# Patient Record
Sex: Male | Born: 1965 | Race: White | Hispanic: No | Marital: Single | Smoking: Current every day smoker
Health system: Southern US, Community
[De-identification: ages and names within clinical notes are randomized; demographics above are authoritative.]

## PROBLEM LIST (undated history)

## (undated) DIAGNOSIS — J189 Pneumonia, unspecified organism: Secondary | ICD-10-CM

## (undated) DIAGNOSIS — I214 Non-ST elevation (NSTEMI) myocardial infarction: Secondary | ICD-10-CM

## (undated) DIAGNOSIS — F17200 Nicotine dependence, unspecified, uncomplicated: Secondary | ICD-10-CM

## (undated) DIAGNOSIS — F419 Anxiety disorder, unspecified: Secondary | ICD-10-CM

## (undated) DIAGNOSIS — M542 Cervicalgia: Secondary | ICD-10-CM

## (undated) DIAGNOSIS — W3400XA Accidental discharge from unspecified firearms or gun, initial encounter: Secondary | ICD-10-CM

## (undated) DIAGNOSIS — M25519 Pain in unspecified shoulder: Secondary | ICD-10-CM

## (undated) DIAGNOSIS — S14105A Unspecified injury at C5 level of cervical spinal cord, initial encounter: Secondary | ICD-10-CM

## (undated) DIAGNOSIS — G8929 Other chronic pain: Secondary | ICD-10-CM

## (undated) DIAGNOSIS — G8250 Quadriplegia, unspecified: Secondary | ICD-10-CM

## (undated) HISTORY — PX: PEG TUBE PLACEMENT: SUR1034

---

## 1998-11-25 ENCOUNTER — Encounter (HOSPITAL_COMMUNITY): Admission: RE | Admit: 1998-11-25 | Discharge: 1999-02-23 | Payer: Self-pay | Admitting: Plastic Surgery

## 2000-02-25 ENCOUNTER — Encounter: Admission: RE | Admit: 2000-02-25 | Discharge: 2000-02-25 | Payer: Self-pay | Admitting: Family Medicine

## 2000-02-25 ENCOUNTER — Encounter: Payer: Self-pay | Admitting: Family Medicine

## 2001-04-19 ENCOUNTER — Encounter: Admission: RE | Admit: 2001-04-19 | Discharge: 2001-07-09 | Payer: Self-pay | Admitting: Anesthesiology

## 2001-05-16 ENCOUNTER — Ambulatory Visit (HOSPITAL_BASED_OUTPATIENT_CLINIC_OR_DEPARTMENT_OTHER): Admission: RE | Admit: 2001-05-16 | Discharge: 2001-05-16 | Payer: Self-pay | Admitting: Oral Surgery

## 2004-02-26 ENCOUNTER — Ambulatory Visit (HOSPITAL_COMMUNITY): Admission: RE | Admit: 2004-02-26 | Discharge: 2004-02-26 | Payer: Self-pay | Admitting: Oral Surgery

## 2004-04-11 ENCOUNTER — Encounter
Admission: RE | Admit: 2004-04-11 | Discharge: 2004-07-10 | Payer: Self-pay | Admitting: Physical Medicine and Rehabilitation

## 2004-09-04 ENCOUNTER — Encounter: Admission: RE | Admit: 2004-09-04 | Discharge: 2004-09-04 | Payer: Self-pay

## 2011-08-31 ENCOUNTER — Encounter (HOSPITAL_BASED_OUTPATIENT_CLINIC_OR_DEPARTMENT_OTHER): Payer: Medicare Other | Attending: Internal Medicine

## 2011-08-31 DIAGNOSIS — L8990 Pressure ulcer of unspecified site, unspecified stage: Secondary | ICD-10-CM | POA: Insufficient documentation

## 2011-08-31 DIAGNOSIS — F17200 Nicotine dependence, unspecified, uncomplicated: Secondary | ICD-10-CM | POA: Insufficient documentation

## 2011-08-31 DIAGNOSIS — L89509 Pressure ulcer of unspecified ankle, unspecified stage: Secondary | ICD-10-CM | POA: Insufficient documentation

## 2011-08-31 DIAGNOSIS — IMO0002 Reserved for concepts with insufficient information to code with codable children: Secondary | ICD-10-CM | POA: Insufficient documentation

## 2011-08-31 DIAGNOSIS — G8250 Quadriplegia, unspecified: Secondary | ICD-10-CM | POA: Insufficient documentation
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
## 2011-09-29 ENCOUNTER — Encounter (HOSPITAL_BASED_OUTPATIENT_CLINIC_OR_DEPARTMENT_OTHER): Payer: Medicare Other

## 2011-10-05 ENCOUNTER — Encounter (HOSPITAL_BASED_OUTPATIENT_CLINIC_OR_DEPARTMENT_OTHER): Payer: Medicare Other

## 2014-10-08 ENCOUNTER — Encounter (HOSPITAL_COMMUNITY): Payer: Self-pay | Admitting: *Deleted

## 2014-10-08 ENCOUNTER — Inpatient Hospital Stay (HOSPITAL_COMMUNITY): Payer: Medicare Other

## 2014-10-08 ENCOUNTER — Inpatient Hospital Stay (HOSPITAL_COMMUNITY)
Admission: EM | Admit: 2014-10-08 | Discharge: 2014-10-24 | DRG: 004 | Disposition: A | Discharge status: Other Acute Inpatient Hospital | Payer: Medicare Other | Attending: Internal Medicine | Admitting: Internal Medicine

## 2014-10-08 ENCOUNTER — Emergency Department (HOSPITAL_COMMUNITY): Payer: Medicare Other

## 2014-10-08 DIAGNOSIS — R0602 Shortness of breath: Secondary | ICD-10-CM | POA: Diagnosis present

## 2014-10-08 DIAGNOSIS — J9620 Acute and chronic respiratory failure, unspecified whether with hypoxia or hypercapnia: Secondary | ICD-10-CM

## 2014-10-08 DIAGNOSIS — F17200 Nicotine dependence, unspecified, uncomplicated: Secondary | ICD-10-CM | POA: Diagnosis present

## 2014-10-08 DIAGNOSIS — J9622 Acute and chronic respiratory failure with hypercapnia: Secondary | ICD-10-CM | POA: Diagnosis present

## 2014-10-08 DIAGNOSIS — W3400XS Accidental discharge from unspecified firearms or gun, sequela: Secondary | ICD-10-CM | POA: Diagnosis not present

## 2014-10-08 DIAGNOSIS — G8929 Other chronic pain: Secondary | ICD-10-CM | POA: Diagnosis present

## 2014-10-08 DIAGNOSIS — M542 Cervicalgia: Secondary | ICD-10-CM | POA: Diagnosis present

## 2014-10-08 DIAGNOSIS — J9690 Respiratory failure, unspecified, unspecified whether with hypoxia or hypercapnia: Secondary | ICD-10-CM

## 2014-10-08 DIAGNOSIS — Z4659 Encounter for fitting and adjustment of other gastrointestinal appliance and device: Secondary | ICD-10-CM

## 2014-10-08 DIAGNOSIS — G8250 Quadriplegia, unspecified: Secondary | ICD-10-CM | POA: Diagnosis present

## 2014-10-08 DIAGNOSIS — Y95 Nosocomial condition: Secondary | ICD-10-CM | POA: Diagnosis not present

## 2014-10-08 DIAGNOSIS — J9621 Acute and chronic respiratory failure with hypoxia: Secondary | ICD-10-CM | POA: Diagnosis present

## 2014-10-08 DIAGNOSIS — I10 Essential (primary) hypertension: Secondary | ICD-10-CM | POA: Diagnosis not present

## 2014-10-08 DIAGNOSIS — J9601 Acute respiratory failure with hypoxia: Secondary | ICD-10-CM | POA: Diagnosis present

## 2014-10-08 DIAGNOSIS — G8254 Quadriplegia, C5-C7 incomplete: Secondary | ICD-10-CM | POA: Diagnosis present

## 2014-10-08 DIAGNOSIS — Z0189 Encounter for other specified special examinations: Secondary | ICD-10-CM

## 2014-10-08 DIAGNOSIS — J449 Chronic obstructive pulmonary disease, unspecified: Secondary | ICD-10-CM | POA: Diagnosis present

## 2014-10-08 DIAGNOSIS — A419 Sepsis, unspecified organism: Principal | ICD-10-CM | POA: Diagnosis present

## 2014-10-08 DIAGNOSIS — E876 Hypokalemia: Secondary | ICD-10-CM | POA: Diagnosis present

## 2014-10-08 DIAGNOSIS — J189 Pneumonia, unspecified organism: Secondary | ICD-10-CM | POA: Diagnosis present

## 2014-10-08 DIAGNOSIS — L89152 Pressure ulcer of sacral region, stage 2: Secondary | ICD-10-CM | POA: Diagnosis not present

## 2014-10-08 DIAGNOSIS — S14155S Other incomplete lesion at C5 level of cervical spinal cord, sequela: Secondary | ICD-10-CM | POA: Diagnosis not present

## 2014-10-08 DIAGNOSIS — E8729 Other acidosis: Secondary | ICD-10-CM | POA: Insufficient documentation

## 2014-10-08 DIAGNOSIS — G904 Autonomic dysreflexia: Secondary | ICD-10-CM | POA: Diagnosis present

## 2014-10-08 DIAGNOSIS — Z9289 Personal history of other medical treatment: Secondary | ICD-10-CM

## 2014-10-08 DIAGNOSIS — F419 Anxiety disorder, unspecified: Secondary | ICD-10-CM | POA: Diagnosis present

## 2014-10-08 DIAGNOSIS — E870 Hyperosmolality and hypernatremia: Secondary | ICD-10-CM | POA: Diagnosis not present

## 2014-10-08 DIAGNOSIS — F17210 Nicotine dependence, cigarettes, uncomplicated: Secondary | ICD-10-CM | POA: Diagnosis present

## 2014-10-08 DIAGNOSIS — R339 Retention of urine, unspecified: Secondary | ICD-10-CM | POA: Diagnosis not present

## 2014-10-08 DIAGNOSIS — Z681 Body mass index (BMI) 19 or less, adult: Secondary | ICD-10-CM

## 2014-10-08 DIAGNOSIS — R739 Hyperglycemia, unspecified: Secondary | ICD-10-CM | POA: Diagnosis present

## 2014-10-08 DIAGNOSIS — R197 Diarrhea, unspecified: Secondary | ICD-10-CM | POA: Diagnosis not present

## 2014-10-08 DIAGNOSIS — E875 Hyperkalemia: Secondary | ICD-10-CM | POA: Diagnosis not present

## 2014-10-08 DIAGNOSIS — E46 Unspecified protein-calorie malnutrition: Secondary | ICD-10-CM | POA: Diagnosis present

## 2014-10-08 DIAGNOSIS — Z930 Tracheostomy status: Secondary | ICD-10-CM

## 2014-10-08 DIAGNOSIS — Z978 Presence of other specified devices: Secondary | ICD-10-CM

## 2014-10-08 DIAGNOSIS — G9341 Metabolic encephalopathy: Secondary | ICD-10-CM | POA: Diagnosis present

## 2014-10-08 DIAGNOSIS — E43 Unspecified severe protein-calorie malnutrition: Secondary | ICD-10-CM | POA: Diagnosis present

## 2014-10-08 DIAGNOSIS — Z8701 Personal history of pneumonia (recurrent): Secondary | ICD-10-CM | POA: Diagnosis not present

## 2014-10-08 DIAGNOSIS — Z7189 Other specified counseling: Secondary | ICD-10-CM

## 2014-10-08 DIAGNOSIS — J9600 Acute respiratory failure, unspecified whether with hypoxia or hypercapnia: Secondary | ICD-10-CM | POA: Insufficient documentation

## 2014-10-08 DIAGNOSIS — E872 Acidosis: Secondary | ICD-10-CM | POA: Diagnosis present

## 2014-10-08 DIAGNOSIS — R609 Edema, unspecified: Secondary | ICD-10-CM | POA: Diagnosis not present

## 2014-10-08 DIAGNOSIS — I248 Other forms of acute ischemic heart disease: Secondary | ICD-10-CM | POA: Diagnosis present

## 2014-10-08 HISTORY — DX: Unspecified injury at C5 level of cervical spinal cord, initial encounter: S14.105A

## 2014-10-08 HISTORY — DX: Anxiety disorder, unspecified: F41.9

## 2014-10-08 HISTORY — DX: Pain in unspecified shoulder: M25.519

## 2014-10-08 HISTORY — DX: Other chronic pain: G89.29

## 2014-10-08 HISTORY — DX: Nicotine dependence, unspecified, uncomplicated: F17.200

## 2014-10-08 HISTORY — DX: Cervicalgia: M54.2

## 2014-10-08 HISTORY — DX: Quadriplegia, unspecified: G82.50

## 2014-10-08 LAB — COMPREHENSIVE METABOLIC PANEL
ALT: 10 U/L (ref 0–53)
ANION GAP: 13 (ref 5–15)
AST: 14 U/L (ref 0–37)
Albumin: 3.6 g/dL (ref 3.5–5.2)
Alkaline Phosphatase: 116 U/L (ref 39–117)
BILIRUBIN TOTAL: 0.5 mg/dL (ref 0.3–1.2)
BUN: 8 mg/dL (ref 6–23)
CHLORIDE: 88 meq/L — AB (ref 96–112)
CO2: 38 meq/L — AB (ref 19–32)
Calcium: 9.7 mg/dL (ref 8.4–10.5)
Creatinine, Ser: 0.3 mg/dL — ABNORMAL LOW (ref 0.50–1.35)
GFR calc Af Amer: >90 mL/min (ref >90)
GLUCOSE: 136 mg/dL — AB (ref 70–99)
Potassium: 3.3 mEq/L — ABNORMAL LOW (ref 3.7–5.3)
Sodium: 139 mEq/L (ref 137–147)
Total Protein: 7.2 g/dL (ref 6.0–8.3)

## 2014-10-08 LAB — URINALYSIS, ROUTINE W REFLEX MICROSCOPIC
Bilirubin Urine: NEGATIVE
Glucose, UA: NEGATIVE mg/dL
Hgb urine dipstick: NEGATIVE
KETONES UR: 15 mg/dL — AB
LEUKOCYTES UA: NEGATIVE
NITRITE: NEGATIVE
PROTEIN: NEGATIVE mg/dL
Specific Gravity, Urine: 1.013 (ref 1.005–1.030)
Urobilinogen, UA: 1 mg/dL (ref 0.0–1.0)
pH: 6 (ref 5.0–8.0)

## 2014-10-08 LAB — CBC WITH DIFFERENTIAL/PLATELET
Basophils Absolute: 0 10*3/uL (ref 0.0–0.1)
Basophils Relative: 0 % (ref 0–1)
Eosinophils Absolute: 0 10*3/uL (ref 0.0–0.7)
Eosinophils Relative: 0 % (ref 0–5)
HCT: 51.3 % (ref 39.0–52.0)
HEMOGLOBIN: 16.9 g/dL (ref 13.0–17.0)
LYMPHS PCT: 5 % — AB (ref 12–46)
Lymphs Abs: 0.7 10*3/uL (ref 0.7–4.0)
MCH: 32.3 pg (ref 26.0–34.0)
MCHC: 32.9 g/dL (ref 30.0–36.0)
MCV: 97.9 fL (ref 78.0–100.0)
MONO ABS: 0.9 10*3/uL (ref 0.1–1.0)
MONOS PCT: 6 % (ref 3–12)
NEUTROS ABS: 13.5 10*3/uL — AB (ref 1.7–7.7)
Neutrophils Relative %: 89 % — ABNORMAL HIGH (ref 43–77)
Platelets: 193 10*3/uL (ref 150–400)
RBC: 5.24 MIL/uL (ref 4.22–5.81)
RDW: 12.9 % (ref 11.5–15.5)
WBC: 15.1 10*3/uL — AB (ref 4.0–10.5)

## 2014-10-08 LAB — BLOOD GAS, ARTERIAL
Acid-Base Excess: 12.4 mmol/L — ABNORMAL HIGH (ref 0.0–2.0)
Bicarbonate: 43.9 mEq/L — ABNORMAL HIGH (ref 20.0–24.0)
DELIVERY SYSTEMS: POSITIVE
DRAWN BY: 308601
Drawn by: 308601
EXPIRATORY PAP: 5
FIO2: 1 %
FIO2: 1 %
Inspiratory PAP: 15
Mode: POSITIVE
O2 SAT: 98.1 %
O2 Saturation: 94.7 %
PATIENT TEMPERATURE: 98.6
PCO2 ART: 90.1 mmHg — AB (ref 35.0–45.0)
PH ART: 7.308 — AB (ref 7.350–7.450)
Patient temperature: 98.6
TCO2: 38.3 mmol/L (ref 0–100)
pH, Arterial: 7.226 — ABNORMAL LOW (ref 7.350–7.450)
pO2, Arterial: 75.3 mmHg — ABNORMAL LOW (ref 80.0–100.0)
pO2, Arterial: 131 mmHg — ABNORMAL HIGH (ref 80.0–100.0)

## 2014-10-08 LAB — I-STAT TROPONIN, ED: Troponin i, poc: 0.12 ng/mL (ref 0.00–0.08)

## 2014-10-08 LAB — I-STAT CG4 LACTIC ACID, ED: LACTIC ACID, VENOUS: 0.98 mmol/L (ref 0.5–2.2)

## 2014-10-08 MED ORDER — DEXTROSE 5 % IV SOLN
500.0000 mg | Freq: Once | INTRAVENOUS | Status: AC
  Administered 2014-10-08: 500 mg via INTRAVENOUS
  Filled 2014-10-08: qty 500

## 2014-10-08 MED ORDER — IPRATROPIUM BROMIDE 0.02 % IN SOLN
0.5000 mg | Freq: Once | RESPIRATORY_TRACT | Status: AC
  Administered 2014-10-08: 0.5 mg via RESPIRATORY_TRACT
  Filled 2014-10-08: qty 2.5

## 2014-10-08 MED ORDER — ALBUTEROL SULFATE (2.5 MG/3ML) 0.083% IN NEBU
5.0000 mg | INHALATION_SOLUTION | Freq: Once | RESPIRATORY_TRACT | Status: AC
  Administered 2014-10-08: 5 mg via RESPIRATORY_TRACT
  Filled 2014-10-08: qty 6

## 2014-10-08 MED ORDER — SODIUM CHLORIDE 0.9 % IV SOLN
250.0000 mL | INTRAVENOUS | Status: DC | PRN
  Administered 2014-10-11: 250 mL via INTRAVENOUS

## 2014-10-08 MED ORDER — POTASSIUM CHLORIDE 10 MEQ/100ML IV SOLN
10.0000 meq | INTRAVENOUS | Status: DC
  Administered 2014-10-09 (×3): 10 meq via INTRAVENOUS
  Filled 2014-10-08 (×4): qty 100

## 2014-10-08 MED ORDER — FENTANYL CITRATE 0.05 MG/ML IJ SOLN
50.0000 ug | Freq: Once | INTRAMUSCULAR | Status: AC
  Administered 2014-10-08: 50 ug via INTRAVENOUS

## 2014-10-08 MED ORDER — DEXTROSE 5 % IV SOLN
1.0000 g | Freq: Once | INTRAVENOUS | Status: AC
  Administered 2014-10-08: 1 g via INTRAVENOUS
  Filled 2014-10-08: qty 10

## 2014-10-08 MED ORDER — PROPOFOL 10 MG/ML IV EMUL
0.0000 ug/kg/min | INTRAVENOUS | Status: DC
  Administered 2014-10-08: 5 ug/kg/min via INTRAVENOUS
  Administered 2014-10-09 (×3): 35 ug/kg/min via INTRAVENOUS
  Administered 2014-10-10: 30 ug/kg/min via INTRAVENOUS
  Administered 2014-10-10: 50 ug/kg/min via INTRAVENOUS
  Administered 2014-10-10: 30 ug/kg/min via INTRAVENOUS
  Administered 2014-10-11 – 2014-10-12 (×5): 50 ug/kg/min via INTRAVENOUS
  Administered 2014-10-12: 60 ug/kg/min via INTRAVENOUS
  Administered 2014-10-12: 25 ug/kg/min via INTRAVENOUS
  Administered 2014-10-13: 50 ug/kg/min via INTRAVENOUS
  Administered 2014-10-13: 49.632 ug/kg/min via INTRAVENOUS
  Administered 2014-10-13: 45 ug/kg/min via INTRAVENOUS
  Administered 2014-10-14: 25 ug/kg/min via INTRAVENOUS
  Administered 2014-10-14: 35 ug/kg/min via INTRAVENOUS
  Administered 2014-10-14: 40 ug/kg/min via INTRAVENOUS
  Administered 2014-10-15 (×3): 35 ug/kg/min via INTRAVENOUS
  Administered 2014-10-16: 34.926 ug/kg/min via INTRAVENOUS
  Administered 2014-10-16: 35 ug/kg/min via INTRAVENOUS
  Filled 2014-10-08 (×27): qty 100

## 2014-10-08 MED ORDER — ROCURONIUM BROMIDE 50 MG/5ML IV SOLN
INTRAVENOUS | Status: AC
  Filled 2014-10-08: qty 2

## 2014-10-08 MED ORDER — HEPARIN SODIUM (PORCINE) 5000 UNIT/ML IJ SOLN
5000.0000 [IU] | Freq: Three times a day (TID) | INTRAMUSCULAR | Status: DC
  Administered 2014-10-09 – 2014-10-24 (×44): 5000 [IU] via SUBCUTANEOUS
  Filled 2014-10-08 (×43): qty 1

## 2014-10-08 MED ORDER — DEXTROSE 5 % IV SOLN
500.0000 mg | INTRAVENOUS | Status: DC
  Administered 2014-10-09 – 2014-10-13 (×5): 500 mg via INTRAVENOUS
  Filled 2014-10-08 (×5): qty 500

## 2014-10-08 MED ORDER — SUCCINYLCHOLINE CHLORIDE 20 MG/ML IJ SOLN
INTRAMUSCULAR | Status: AC
  Administered 2014-10-08: 100 mg
  Filled 2014-10-08: qty 1

## 2014-10-08 MED ORDER — DEXTROSE 5 % IV SOLN
1.0000 g | INTRAVENOUS | Status: AC
Start: 2014-10-09 — End: 2014-10-16
  Administered 2014-10-09 – 2014-10-16 (×8): 1 g via INTRAVENOUS
  Filled 2014-10-08 (×7): qty 10

## 2014-10-08 MED ORDER — ETOMIDATE 2 MG/ML IV SOLN
INTRAVENOUS | Status: AC | PRN
  Administered 2014-10-08: 20 mg via INTRAVENOUS

## 2014-10-08 MED ORDER — SUCCINYLCHOLINE CHLORIDE 20 MG/ML IJ SOLN
INTRAMUSCULAR | Status: AC | PRN
  Administered 2014-10-08: 100 mg via INTRAVENOUS

## 2014-10-08 MED ORDER — MIDAZOLAM HCL 2 MG/2ML IJ SOLN
INTRAMUSCULAR | Status: AC
  Administered 2014-10-08: 2 mg via INTRAVENOUS
  Filled 2014-10-08: qty 2

## 2014-10-08 MED ORDER — ETOMIDATE 2 MG/ML IV SOLN
INTRAVENOUS | Status: AC
  Administered 2014-10-08: 20 mg
  Filled 2014-10-08: qty 20

## 2014-10-08 MED ORDER — PANTOPRAZOLE SODIUM 40 MG IV SOLR
40.0000 mg | Freq: Every day | INTRAVENOUS | Status: DC
  Administered 2014-10-08 – 2014-10-14 (×7): 40 mg via INTRAVENOUS
  Filled 2014-10-08 (×7): qty 40

## 2014-10-08 MED ORDER — SODIUM CHLORIDE 0.9 % IV SOLN
INTRAVENOUS | Status: DC
  Administered 2014-10-09 – 2014-10-10 (×2): via INTRAVENOUS

## 2014-10-08 MED ORDER — FENTANYL CITRATE 0.05 MG/ML IJ SOLN
INTRAMUSCULAR | Status: AC
  Filled 2014-10-08: qty 2

## 2014-10-08 MED ORDER — LIDOCAINE HCL (CARDIAC) 20 MG/ML IV SOLN
INTRAVENOUS | Status: AC
  Filled 2014-10-08: qty 5

## 2014-10-08 MED ORDER — SODIUM CHLORIDE 0.9 % IV BOLUS (SEPSIS)
1000.0000 mL | Freq: Once | INTRAVENOUS | Status: AC
Start: 2014-10-08 — End: 2014-10-09
  Administered 2014-10-08: 1000 mL via INTRAVENOUS

## 2014-10-08 MED ORDER — MIDAZOLAM HCL 2 MG/2ML IJ SOLN
2.0000 mg | Freq: Once | INTRAMUSCULAR | Status: AC
  Administered 2014-10-08: 2 mg via INTRAVENOUS

## 2014-10-08 MED ORDER — FENTANYL CITRATE 0.05 MG/ML IJ SOLN
100.0000 ug | INTRAMUSCULAR | Status: DC | PRN
Start: 2014-10-08 — End: 2014-10-09
  Filled 2014-10-08: qty 2

## 2014-10-08 NOTE — ED Notes (Signed)
RT called to bedside.

## 2014-10-08 NOTE — ED Notes (Addendum)
Pt sent from [facility], note states " quadriplegic pt with hx of pna here with complaint of cough and chest congestion, worsening sob over the last several days. Pulse ox was 61% on his arrival, initially placed on 6 later per min oxygen to get pulse ox above 90% and then given an albuterol nebulizer treatment, desaturated again to 76% after taking oxygen off. He has had sweats and chills. No chest pain or edema. No change in urinary status, chronically incontinent, no recurent UTI." pt pupils constricted.   18 g R AC

## 2014-10-08 NOTE — ED Notes (Signed)
Bed: WA23 Expected date:  Expected time:  Means of arrival:  Comments: EMS-SOB 

## 2014-10-08 NOTE — ED Provider Notes (Addendum)
CSN: [id number]     Arrival date & time 10/08/14  1837 History   First MD Initiated Contact with Patient 10/08/14 1907     Chief Complaint  Patient presents with  . Shortness of Breath     (Consider location/radiation/quality/duration/timing/severity/associated sxs/prior Treatment) HPI Comments: Pt has a history of quadriplegia with recurrent pneumonias. He does not wear oxygen at home however for the last 1 week he's had URI symptoms worsen. He went to his doctor today and was satting 61% on room air and sent here for further evaluation.  Patient is a 48 y.o. male presenting with shortness of breath. The history is provided by the patient and a caregiver.  Shortness of Breath Severity:  Severe Onset quality:  Gradual Duration:  1 week Timing:  Constant Progression:  Worsening Chronicity:  Recurrent Context: URI   Relieved by:  Nothing Worsened by:  Nothing tried Ineffective treatments:  None tried Associated symptoms: cough and sputum production   Associated symptoms: no abdominal pain, no chest pain, no fever, no vomiting and no wheezing   Risk factors comment:  Prior hx of PNA and is also a heavy smoker   Past Medical History  Diagnosis Date  . Quadriplegia   . Anxiety   . Tobacco dependence   . Chronic neck pain   . Chronic shoulder pain   . Spinal cord injury, C5-C7    History reviewed. No pertinent past surgical history. History reviewed. No pertinent family history. History  Substance Use Topics  . Smoking status: Current Every Day Smoker  . Smokeless tobacco: Not on file  . Alcohol Use: Not on file    Review of Systems  Constitutional: Negative for fever.  Respiratory: Positive for cough, sputum production and shortness of breath. Negative for wheezing.   Cardiovascular: Negative for chest pain.  Gastrointestinal: Negative for vomiting and abdominal pain.  All other systems reviewed and are negative.     Allergies  Contrast media  Home Medications    Prior to Admission medications   Not on File   BP 143/99 mmHg  Pulse 95  Resp 24  SpO2 98% Physical Exam  Constitutional: He is oriented to person, place, and time. He appears well-developed. He appears cachectic. He appears distressed.  HENT:  Head: Normocephalic and atraumatic.  Mouth/Throat: Oropharynx is clear and moist.  Eyes: Conjunctivae and EOM are normal. Pupils are equal, round, and reactive to light.  Neck: Normal range of motion. Neck supple.  Cardiovascular: Normal rate, regular rhythm and intact distal pulses.   No murmur heard. Pulmonary/Chest: Accessory muscle usage present. Tachypnea noted. No respiratory distress. He has decreased breath sounds. He has no wheezes. He has no rales.  Abdominal: Soft. He exhibits no distension. There is no tenderness. There is no rebound and no guarding.  Musculoskeletal: Normal range of motion. He exhibits no edema or tenderness.  No decubitus ulcers  Neurological: He is alert and oriented to person, place, and time.  Quadriplegia.  Contractures of the upper ext but able to move some.  Skin: Skin is warm. No rash noted. He is diaphoretic. No erythema.  Psychiatric: He has a normal mood and affect. His behavior is normal.  Nursing note and vitals reviewed.   ED Course  Procedures (including critical care time) Labs Review Labs Reviewed  CBC WITH DIFFERENTIAL - Abnormal; Notable for the following:    WBC 15.1 (*)    Neutrophils Relative % 89 (*)    Neutro Abs 13.5 (*)  Lymphocytes Relative 5 (*)    All other components within normal limits  COMPREHENSIVE METABOLIC PANEL - Abnormal; Notable for the following:    Potassium 3.3 (*)    Chloride 88 (*)    CO2 38 (*)    Glucose, Bld 136 (*)    Creatinine, Ser 0.30 (*)    All other components within normal limits  URINALYSIS, ROUTINE W REFLEX MICROSCOPIC - Abnormal; Notable for the following:    Ketones, ur 15 (*)    All other components within normal limits  BLOOD GAS,  ARTERIAL - Abnormal; Notable for the following:    pH, Arterial 7.308 (*)    pCO2 arterial 90.1 (*)    pO2, Arterial 75.3 (*)    Bicarbonate 43.9 (*)    Acid-Base Excess 12.4 (*)    All other components within normal limits  I-STAT TROPOININ, ED - Abnormal; Notable for the following:    Troponin i, poc 0.12 (*)    All other components within normal limits  BLOOD GAS, ARTERIAL  I-STAT CG4 LACTIC ACID, ED    Imaging Review Dg Chest Port 1 View  10/08/2014   CLINICAL DATA:  Cough, congestion and shortness of breath. Initial encounter.  EXAM: PORTABLE CHEST - 1 VIEW  COMPARISON:  None.  FINDINGS: Left lower lung airspace disease is compatible with pneumonia.  There is no evidence of pneumothorax.  No acute bony abnormalities are identified.  A moderate to severe thoracic scoliosis is unchanged.  IMPRESSION: Left lower lung pneumonia. Radiographic follow-up to resolution is recommended.   Electronically Signed   By: Laveda AbbeJeff  Hu M.D.   On: 10/08/2014 20:17     EKG Interpretation   Date/Time:  Monday October 08 2014 19:17:05 EST Ventricular Rate:  64 PR Interval:  160 QRS Duration: 96 QT Interval:  426 QTC Calculation: 439 R Axis:   104 Text Interpretation:  Sinus rhythm RAE, consider biatrial enlargement  Right axis deviation ST elevation suggests acute pericarditis Baseline  wander in lead(s) V1 No previous tracing Confirmed by Anitra LauthPLUNKETT  MD,  Alphonzo LemmingsWHITNEY ([id number]) on 10/08/2014 7:40:55 PM      MDM   Final diagnoses:  SOB (shortness of breath)  CAP (community acquired pneumonia)  Respiratory acidosis    Patient presenting from a physician's office with worsening cough and congestion for the last 1 week and when he arrived at the office he was satting 61% on room air. Patient has a history of quadriplegia and pneumonia in the past. He does smoke. Upon arrival here patient was diaphoretic, pale, tachypnea with decreased air movement. O2 sats 79%. Oxygen improved on a nonrebreather to  96%. Patient is afebrile with a stable blood pressure.  Significant decrease in breath sounds bilaterally without any wheezing.  Concern for possible COPD exacerbation versus pneumonia. Low suspicion for cardiac etiology at this time. Patient denies any chest pain and no prior history of cardiac problems.  CBC, CMP, UA, ABG, troponin, lactic acid, chest x-ray pending. Patient given albuterol and Atrovent.  7:41 PM ABG consistent with resp acidosis with CO2 of 90 that is partially compensated.  Bipap ordered.  9:06 PM Patient has a leukocytosis of 15,000. Chest x-ray consistent with a left lower lobe pneumonia. After albuterol, Atrovent BiPAP patient is still diaphoretic but is breathing more comfortably and satting 99% on room air. He was started on Rocephin and azithromycin. Critical care consult that for further care. Repeat ABG pending  10:06 PM ABG was worsening and the decision was made to intubate  the patient. Patient remained stable after intubation.  INTUBATION Performed by: Gwyneth Sprout  Required items: required blood products, implants, devices, and special equipment available Patient identity confirmed: provided demographic data and hospital-assigned identification number Time out: Immediately prior to procedure a "time out" was called to verify the correct patient, procedure, equipment, support staff and site/side marked as required.  Indications: hypoxia, hypercarbia  Intubation method: Glidescope Laryngoscopy   Preoxygenation: bipap  Sedatives: 20 Etomidate Paralytic: 100 Succinylcholine  Tube Size: 7.0 cuffed  Post-procedure assessment: chest rise and ETCO2 monitor Breath sounds: equal and absent over the epigastrium Tube secured with: ETT holder Chest x-ray interpreted by radiologist and me.  Chest x-ray findings: endotracheal tube in appropriate position  Patient tolerated the procedure well with no immediate complications.    CRITICAL CARE Performed  by: Gwyneth Sprout Total critical care time: 45 Critical care time was exclusive of separately billable procedures and treating other patients. Critical care was necessary to treat or prevent imminent or life-threatening deterioration. Critical care was time spent personally by me on the following activities: development of treatment plan with patient and/or surrogate as well as nursing, discussions with consultants, evaluation of patient's response to treatment, examination of patient, obtaining history from patient or surrogate, ordering and performing treatments and interventions, ordering and review of laboratory studies, ordering and review of radiographic studies, pulse oximetry and re-evaluation of patient's condition.    Gwyneth Sprout, MD 10/08/14 8295  Gwyneth Sprout, MD 10/08/14 2207

## 2014-10-08 NOTE — Progress Notes (Signed)
Repeat ABG results did not cross over into computer.  Dr. Anitra LauthPlunkett notified of critical results at 2110.  Current results are PH- 7.226, CO2-results above reportable range, PO2-131, Sats are 98%.  Pt currently on BIPAP 15/5 and 100%.  After ABG settings changed to 16/5 and 50% with BUR of 14.  RT to monitor and assess as needed.

## 2014-10-08 NOTE — Progress Notes (Signed)
ANTIBIOTIC CONSULT NOTE - INITIAL  Pharmacy Consult for ceftriaxone and azithromycin Indication: pneumonia  Allergies  Allergen Reactions  . Contrast Media [Iodinated Diagnostic Agents]     Patient Measurements: No weight on file  Vital Signs: BP: 112/74 mmHg (11/30 2205) Pulse Rate: 49 (11/30 2205)  Labs:  Recent Labs  10/08/14 1941  WBC 15.1*  HGB 16.9  PLT 193  CREATININE 0.30*    Medical History: Past Medical History  Diagnosis Date  . Quadriplegia   . Anxiety   . Tobacco dependence   . Chronic neck pain   . Chronic shoulder pain   . Spinal cord injury, C5-C7     Medications:  Scheduled:  . fentaNYL      . heparin  5,000 Units Subcutaneous 3 times per day  . lidocaine (cardiac) 100 mg/875ml      . midazolam      . midazolam  2 mg Intravenous Once  . pantoprazole (PROTONIX) IV  40 mg Intravenous QHS  . rocuronium       Infusions:  . sodium chloride    . sodium chloride    . azithromycin (ZITHROMAX) 500 MG IVPB 500 mg (10/08/14 2149)  . propofol     Assessment: [age] yoM admitted 11/30 with ShOB. Pt with Hx quadriplegia and recurrent pneumonias. Pt on O2 at home, also with current tobacco abuse. MD with CAP vs AECOPD in differential. Pharmacy id consulted to dose ceftriaxone and azithromycin for CAP.  Antiinfectives  11/30 >> ceftriaxone >> 11/30 >> azithromycin >>    Labs / vitals Tmax: no temp on file WBCs: 15.1k Renal: SCr 0.3, CrCl unknown with unknown height and weight  Microbiology 11/30 sputum: ordered   Goal of Therapy:  Azithromycin and ceftriaxone per indication  Plan:  - continue ceftriaxone 1g IV q24h - continue azithromycin 500mg  IV q24h - no dose adjustments needed for either antibiotic, pharmacy will sign-off  Thank you for the consult.  Ross LudwigJesse Tameron Lama Akers, PharmD, BCPS Pager: [phone] Pharmacy: [phone] 10/08/2014 10:26 PM

## 2014-10-08 NOTE — H&P (Signed)
PULMONARY / CRITICAL CARE MEDICINE   Name: Brandon Montgomery Chagoya MRN: [id number] DOB: 05/16/1966    ADMISSION DATE:  10/08/2014 CONSULTATION DATE:  10/08/2014  REFERRING MD :  EDP  CHIEF COMPLAINT:  SOB  INITIAL PRESENTATION:  48 y.o. M who is a quadriplegic [facility] with cough, congestion, SOB.  In ED, remained hypoxic despite 6L O2, CXR revealed LLL PNA.  Started on BiPAP but ultimately failed and required intubation.   STUDIES:  CXR 11/30 >>> LLL PNA  SIGNIFICANT EVENTS: 11/30 - admitted   HISTORY OF PRESENT ILLNESS:  Pt is encephalopathic; therefore, this HPI is obtained from chart review. Brandon Montgomery Knauer is a 48 y.o. M with PMH as outlined below which includes quadriplegia.  [facility] with cough, congestion, and SOB x a few days.  Symptoms have gradually progressed since onset.  Cough productive of yellowish sputum.  Denies any fevers/chills/swats, chest pain, N/V/D, abdominal pain. SpO2 was 61% on ED arrival, pt placed on 6L O2 in order to get sats > 90%; however, quickly desaturated into 70's as soon as O2 removed.  CXR revealed LLL infiltrate c/w CAP.  He was placed on BiPAP but repeat ABG post BiPAP worse than initial.  He was subsequently intubated by EDP and PCCM was called for admission.   PAST MEDICAL HISTORY :   has a past medical history of Quadriplegia; Anxiety; Tobacco dependence; Chronic neck pain; Chronic shoulder pain; and Spinal cord injury, C5-C7.  has no past surgical history on file. Prior to Admission medications   Medication Sig Start Date End Date Taking? Authorizing Provider  acetaminophen (TYLENOL) 500 MG tablet Take 500 mg by mouth every 6 (six) hours as needed for moderate pain.   Yes Historical Provider, MD  clonazePAM (KLONOPIN) 1 MG tablet Take 1-2 mg by mouth 4 (four) times daily as needed for anxiety.   Yes Historical Provider, MD  Oxycodone HCl 10 MG TABS Take 10-20 mg by mouth 4 (four) times daily as needed (pain).   Yes  Historical Provider, MD   Allergies  Allergen Reactions  . Contrast Media [Iodinated Diagnostic Agents]     FAMILY HISTORY:  History reviewed. No pertinent family history.  SOCIAL HISTORY:  reports that he has been smoking.  He does not have any smokeless tobacco history on file. His alcohol and drug histories are not on file.  REVIEW OF SYSTEMS:  Unable to obtain as pt is encephalopathic.  SUBJECTIVE:   VITAL SIGNS: Pulse Rate:  [61-95] 61 (11/30 2126) Resp:  [22-27] 27 (11/30 2126) BP: (125-167)/(80-102) 167/102 mmHg (11/30 2000) SpO2:  [95 %-100 %] 100 % (11/30 2126) HEMODYNAMICS:   VENTILATOR SETTINGS:   INTAKE / OUTPUT: Intake/Output    None     PHYSICAL EXAMINATION: General: Cachectic, young male, in NAD. Neuro: Sedated on vent. HEENT: [place]/AT. PERRL, sclerae anicteric. Cardiovascular: RRR, no M/R/G.  Lungs: Respirations even and unlabored on full vent support.  Scattered rhonchi, diminished in left base. Abdomen: BS x 4, soft, NT/ND.  Musculoskeletal: Muscle wasting throughout, no gross deformities, no edema.  Skin: Intact, warm, no rashes.  LABS:  CBC  Recent Labs Lab 10/08/14 1941  WBC 15.1*  HGB 16.9  HCT 51.3  PLT 193   Coag's No results for input(s): APTT, INR in the last 168 hours. BMET  Recent Labs Lab 10/08/14 1941  NA 139  K 3.3*  CL 88*  CO2 38*  BUN 8  CREATININE 0.30*  GLUCOSE  136*   Electrolytes  Recent Labs Lab 10/08/14 1941  CALCIUM 9.7   Sepsis Markers  Recent Labs Lab 10/08/14 1954  LATICACIDVEN 0.98   ABG  Recent Labs Lab 10/08/14 1922  PHART 7.308*  PCO2ART 90.1*  PO2ART 75.3*   Liver Enzymes  Recent Labs Lab 10/08/14 1941  AST 14  ALT 10  ALKPHOS 116  BILITOT 0.5  ALBUMIN 3.6   Cardiac Enzymes No results for input(s): TROPONINI, PROBNP in the last 168 hours. Glucose No results for input(s): GLUCAP in the last 168 hours.  Imaging Dg Chest Port 1 View  10/08/2014   CLINICAL DATA:   Cough, congestion and shortness of breath. Initial encounter.  EXAM: PORTABLE CHEST - 1 VIEW  COMPARISON:  None.  FINDINGS: Left lower lung airspace disease is compatible with pneumonia.  There is no evidence of pneumothorax.  No acute bony abnormalities are identified.  A moderate to severe thoracic scoliosis is unchanged.  IMPRESSION: Left lower lung pneumonia. Radiographic follow-up to resolution is recommended.   Electronically Signed   By: Laveda AbbeJeff  Hu M.D.   On: 10/08/2014 20:17    ASSESSMENT / PLAN:  PULMONARY OETT 11/30 >>> A: Acute on chronic hypoxemic and hypercarbic respiratory failure CAP COPD without evidence of exacerbation Tobacco use disorder P:   Full mechanical support, wean as able. ABG in 1 hour, adjust vent accordingly. VAP bundle. SBT in AM if able. Abx per ID section. ABG and CXR in AM. Tobacco cessation.  CARDIOVASCULAR A:  Sepsis without evidence of shock - likely due to CAP Troponin leak - suspect demand ischemia, EKG with no acute findings. P:  Trend troponins.  RENAL A:   Hypokalemia P:   Potassium x 4 runs. NS @ 50. BMP in AM.  GASTROINTESTINAL A:   GI prophylaxis Protein calorie malnutrition Nutrition P:   SUP: Pantoprazole. NPO. TF if remains NPO > 24 hours.  HEMATOLOGIC A:   VTE Prophylaxis P:  SCD's / Heparin. CBC in AM.  INFECTIOUS A:   Sepsis - due to CAP P:   Sputum Cx 11/30 >>> U. Strep 11/30 >>> Abx: Ceftriaxone, start date 11/30, day 1/x. Abx: Azithromycin, start date 11/30, day 1/x.  ENDOCRINE A:   No known issues P:   SSI if glucose consistently > 180.  NEUROLOGIC A:   Acute metabolic encephalopathy Spinal cord injury C5 - C7 Chronic neck pain Anxiety P:   Sedation:  Propofol gtt / Fentanyl PRN. RASS goal: 0 to -1. Daily WUA. Hold outpatient clonazepam, oxycodone.   Family updated: None available.  Interdisciplinary Family Meeting v Palliative Care Meeting:  Due by: 12/6.  CC Time:  45  minutes.   Rutherford Guysahul Malikiah Debarr, GeorgiaPA - [facility] Pgr: [phone]  or [phone] 10/08/2014, 9:35 PM

## 2014-10-08 NOTE — ED Notes (Signed)
IV removed from Right Forearm, per RN.

## 2014-10-09 ENCOUNTER — Inpatient Hospital Stay (HOSPITAL_COMMUNITY): Payer: Medicare Other

## 2014-10-09 DIAGNOSIS — G8250 Quadriplegia, unspecified: Secondary | ICD-10-CM

## 2014-10-09 DIAGNOSIS — M79609 Pain in unspecified limb: Secondary | ICD-10-CM

## 2014-10-09 DIAGNOSIS — A419 Sepsis, unspecified organism: Principal | ICD-10-CM

## 2014-10-09 DIAGNOSIS — F17200 Nicotine dependence, unspecified, uncomplicated: Secondary | ICD-10-CM | POA: Diagnosis present

## 2014-10-09 DIAGNOSIS — R609 Edema, unspecified: Secondary | ICD-10-CM

## 2014-10-09 DIAGNOSIS — I369 Nonrheumatic tricuspid valve disorder, unspecified: Secondary | ICD-10-CM

## 2014-10-09 DIAGNOSIS — R6520 Severe sepsis without septic shock: Secondary | ICD-10-CM

## 2014-10-09 DIAGNOSIS — J9601 Acute respiratory failure with hypoxia: Secondary | ICD-10-CM

## 2014-10-09 LAB — BLOOD GAS, ARTERIAL
Acid-Base Excess: 12.9 mmol/L — ABNORMAL HIGH (ref 0.0–2.0)
Acid-Base Excess: 12.3 mmol/L — ABNORMAL HIGH (ref 0.0–2.0)
Acid-Base Excess: 11.3 mmol/L — ABNORMAL HIGH (ref 0.0–2.0)
BICARBONATE: 39.4 meq/L — AB (ref 20.0–24.0)
BICARBONATE: 39.4 meq/L — AB (ref 20.0–24.0)
BICARBONATE: 39.6 meq/L — AB (ref 20.0–24.0)
DRAWN BY: 232811
Drawn by: 232811
Drawn by: 308601
FIO2: 0.9 %
FIO2: 0.9 %
FIO2: 1 %
LHR: 18 {breaths}/min
MECHVT: 410 mL
MECHVT: 410 mL
MECHVT: 500 mL
O2 SAT: 97.6 %
O2 Saturation: 95.6 %
O2 Saturation: 93.8 %
PCO2 ART: 50.7 mmHg — AB (ref 35.0–45.0)
PCO2 ART: 57.7 mmHg — AB (ref 35.0–45.0)
PEEP/CPAP: 5 cmH2O
PEEP: 14 cmH2O
PEEP: 14 cmH2O
PH ART: 7.496 — AB (ref 7.350–7.450)
PO2 ART: 64.4 mmHg — AB (ref 80.0–100.0)
Patient temperature: 35.7
Patient temperature: 36.8
Patient temperature: 98.6
RATE: 22 resp/min
RATE: 20 resp/min
TCO2: 32.7 mmol/L (ref 0–100)
TCO2: 33.7 mmol/L (ref 0–100)
TCO2: 32.4 mmol/L (ref 0–100)
pCO2 arterial: 66.9 mmHg (ref 35.0–45.0)
pH, Arterial: 7.448 (ref 7.350–7.450)
pH, Arterial: 7.39 (ref 7.350–7.450)
pO2, Arterial: 65.2 mmHg — ABNORMAL LOW (ref 80.0–100.0)
pO2, Arterial: 96.8 mmHg (ref 80.0–100.0)

## 2014-10-09 LAB — BASIC METABOLIC PANEL
Anion gap: 14 (ref 5–15)
BUN: 9 mg/dL (ref 6–23)
CO2: 36 mEq/L — ABNORMAL HIGH (ref 19–32)
Calcium: 9.6 mg/dL (ref 8.4–10.5)
Chloride: 89 mEq/L — ABNORMAL LOW (ref 96–112)
Creatinine, Ser: 0.26 mg/dL — ABNORMAL LOW (ref 0.50–1.35)
GLUCOSE: 117 mg/dL — AB (ref 70–99)
POTASSIUM: 3.2 meq/L — AB (ref 3.7–5.3)
Sodium: 139 mEq/L (ref 137–147)

## 2014-10-09 LAB — GLUCOSE, CAPILLARY: Glucose-Capillary: 189 mg/dL — ABNORMAL HIGH (ref 70–99)

## 2014-10-09 LAB — CBC
HCT: 49.8 % (ref 39.0–52.0)
Hemoglobin: 16.7 g/dL (ref 13.0–17.0)
MCH: 31.7 pg (ref 26.0–34.0)
MCHC: 33.5 g/dL (ref 30.0–36.0)
MCV: 94.7 fL (ref 78.0–100.0)
Platelets: 202 10*3/uL (ref 150–400)
RBC: 5.26 MIL/uL (ref 4.22–5.81)
RDW: 12.7 % (ref 11.5–15.5)
WBC: 16.1 10*3/uL — AB (ref 4.0–10.5)

## 2014-10-09 LAB — PHOSPHORUS: PHOSPHORUS: 2.3 mg/dL (ref 2.3–4.6)

## 2014-10-09 LAB — STREP PNEUMONIAE URINARY ANTIGEN: STREP PNEUMO URINARY ANTIGEN: NEGATIVE

## 2014-10-09 LAB — TRIGLYCERIDES: Triglycerides: 83 mg/dL (ref <150)

## 2014-10-09 LAB — MAGNESIUM: MAGNESIUM: 1.8 mg/dL (ref 1.5–2.5)

## 2014-10-09 LAB — TROPONIN I: Troponin I: <0.3 ng/mL (ref <0.30)

## 2014-10-09 LAB — MRSA PCR SCREENING: MRSA BY PCR: NEGATIVE

## 2014-10-09 LAB — D-DIMER, QUANTITATIVE (NOT AT ARMC): D DIMER QUANT: 0.28 ug{FEU}/mL (ref 0.00–0.48)

## 2014-10-09 MED ORDER — POTASSIUM CHLORIDE 10 MEQ/100ML IV SOLN
10.0000 meq | INTRAVENOUS | Status: DC
  Administered 2014-10-09: 10 meq via INTRAVENOUS
  Filled 2014-10-09 (×4): qty 100

## 2014-10-09 MED ORDER — CETYLPYRIDINIUM CHLORIDE 0.05 % MT LIQD
7.0000 mL | Freq: Four times a day (QID) | OROMUCOSAL | Status: DC
  Administered 2014-10-09 – 2014-10-16 (×28): 7 mL via OROMUCOSAL

## 2014-10-09 MED ORDER — CHLORHEXIDINE GLUCONATE 0.12 % MT SOLN
15.0000 mL | Freq: Two times a day (BID) | OROMUCOSAL | Status: DC
  Administered 2014-10-09 – 2014-10-16 (×15): 15 mL via OROMUCOSAL
  Filled 2014-10-09 (×14): qty 15

## 2014-10-09 MED ORDER — VITAL HIGH PROTEIN PO LIQD
1000.0000 mL | ORAL | Status: DC
  Administered 2014-10-09 – 2014-10-10 (×2): 1000 mL
  Filled 2014-10-09 (×2): qty 1000

## 2014-10-09 MED ORDER — VECURONIUM BROMIDE 10 MG IV SOLR: 10.0000 mg | Freq: Once | INTRAVENOUS | Status: DC

## 2014-10-09 MED ORDER — SODIUM CHLORIDE 0.9 % IV SOLN
Freq: Once | INTRAVENOUS | Status: AC
  Administered 2014-10-09: 15:00:00 via INTRAVENOUS

## 2014-10-09 MED ORDER — POTASSIUM CHLORIDE 20 MEQ/15ML (10%) PO SOLN
40.0000 meq | Freq: Once | ORAL | Status: AC
  Administered 2014-10-09: 40 meq via ORAL
  Filled 2014-10-09: qty 30

## 2014-10-09 MED ORDER — FENTANYL CITRATE 0.05 MG/ML IJ SOLN
50.0000 ug | Freq: Once | INTRAMUSCULAR | Status: AC
  Administered 2014-10-09: 100 ug via INTRAVENOUS

## 2014-10-09 MED ORDER — VITAL HIGH PROTEIN PO LIQD
1000.0000 mL | ORAL | Status: DC
  Administered 2014-10-09: 1000 mL
  Filled 2014-10-09: qty 1000

## 2014-10-09 MED ORDER — SODIUM CHLORIDE 0.9 % IV SOLN
25.0000 ug/h | INTRAVENOUS | Status: DC
  Administered 2014-10-09: 50 ug/h via INTRAVENOUS
  Administered 2014-10-09: 225 ug/h via INTRAVENOUS
  Administered 2014-10-09: 50 ug/h via INTRAVENOUS
  Administered 2014-10-09: 225 ug/h via INTRAVENOUS
  Administered 2014-10-10: 300 ug/h via INTRAVENOUS
  Administered 2014-10-10: 200 ug/h via INTRAVENOUS
  Administered 2014-10-11 – 2014-10-12 (×4): 300 ug/h via INTRAVENOUS
  Administered 2014-10-12: 350 ug/h via INTRAVENOUS
  Administered 2014-10-12 – 2014-10-13 (×3): 200 ug/h via INTRAVENOUS
  Administered 2014-10-14: 50 ug/h via INTRAVENOUS
  Administered 2014-10-14: 150 ug/h via INTRAVENOUS
  Administered 2014-10-15: 100 ug/h via INTRAVENOUS
  Filled 2014-10-09 (×15): qty 50

## 2014-10-09 MED ORDER — FENTANYL BOLUS VIA INFUSION
50.0000 ug | INTRAVENOUS | Status: DC | PRN
  Administered 2014-10-10 – 2014-10-12 (×5): 50 ug via INTRAVENOUS
  Filled 2014-10-09: qty 50

## 2014-10-09 NOTE — Plan of Care (Signed)
Problem: Phase I Progression Outcomes Goal: VTE prophylaxis Outcome: Completed/Met Date Met:  10/09/14 Goal: GIProphysixis Outcome: Completed/Met Date Met:  10/09/14 Goal: Oral Care per Protocol Outcome: Completed/Met Date Met:  10/09/14 Goal: HOB elevated 30 degrees Outcome: Completed/Met Date Met:  10/09/14 Goal: VAP prevention protocol initiated Outcome: Completed/Met Date Met:  10/09/14 Goal: Sedation Protocol initiated if indicated Outcome: Completed/Met Date Met:  10/09/14 Goal: Pneumonia/flu vaccination screen completed Outcome: Completed/Met Date Met:  10/09/14

## 2014-10-09 NOTE — Progress Notes (Signed)
2D Echocardiogram has been performed.  Brandon Montgomery, Kenyotta Dorfman M 10/09/2014, 1:34 PM

## 2014-10-09 NOTE — Progress Notes (Signed)
VASCULAR LAB PRELIMINARY  PRELIMINARY  PRELIMINARY  PRELIMINARY  Bilateral lower extremity venous duplex  completed.    Preliminary report:  Bilateral:  No evidence of DVT, superficial thrombosis, or Baker's Cyst.    Gazella Anglin, RVT 10/09/2014, 11:10 AM

## 2014-10-09 NOTE — Progress Notes (Signed)
[facility] FOR AM LAB REPLACEMENT ONLY  The patient does [facility] the [facility]ly[facility] Protocol based on the criteria listed below:   1. Is GFR >/= 40 ml/min? Yes.    Patient's GFR today is >90 2. Is urine output >/= 0.5 ml/kg/hr for the last 6 hours? Yes.   Patient's UOP is 1.6 ml/kg/hr 3. Is BUN < 60 mg/dL? Yes.    Patient's BUN today is 9 4. Abnormal electrolyte(s):K 3.2 5. Ordered repletion with: per protocol 6. If a panic level lab has been reported, has the CCM MD in charge been notified? No..   Physician:    Markus DaftWHELAN, Zaylin Pistilli A 10/09/2014 5:20 AM

## 2014-10-09 NOTE — Progress Notes (Signed)
RT notified MD of ABG results due to not crossing [place].  Results were as follows; PH-7.39, CO2-66.9, PO2-96.8 and HCO3-39.6.  Pt current Vent settings are PRVC 500, RR 18, Peep 5 and 60%.  MD aware Pt 8cc/kg volume is 550 but is okay with pt being on Vt of 500.  RT to monitor and assess as needed.

## 2014-10-09 NOTE — Progress Notes (Signed)
CARE MANAGEMENT NOTE 10/09/2014  Patient:  Brandon Montgomery,Brandon Montgomery   Account Number:  [id number]  Date Initiated:  10/09/2014  Documentation initiated by:  Alga Southall  Subjective/Objective Assessment:   CAP and VDRF/48 y.o. M who is a quadriplegic [facility] with cough, congestion, SOB.  In ED, remained hypoxic despite 6L O2, CXR revealed LLL PNA.  Started on BiPAP but ultimately failed and required intubation.     Action/Plan:   TBD poss home once resp status stable/lives with parent.   Anticipated DC Date:  10/12/2014   Anticipated DC Plan:  HOME W HOME HEALTH SERVICES  In-house referral  NA      DC Planning Services  CM consult      [facility]  [facility]   Choice offered to / List presented to:  NA   DME arranged  NA      DME agency  NA     HH arranged  NA      HH agency  NA   Status of service:  In process, will continue to follow Medicare Important Message given?   (If response is "NO", the following Medicare IM given date fields will be blank) Date Medicare IM given:   Medicare IM given by:   Date Additional Medicare IM given:   Additional Medicare IM given by:    Discharge Disposition:    Per UR Regulation:  Reviewed for med. necessity/level of care/duration of stay  If discussed at Long Length of Stay Meetings, dates discussed:    Comments:  10/09/2014/Brandon Montgomery. Brandon Plateravis, RN, BSN, CCM: Chart review for medical necessity and patient discharge needs. Case Manager will follow for patient condition changes. 09/21/2014/Brandon Montgomery. Brandon Plateravis, RN, BSN, CCM: CHART NOTE FOR PROGRESSION: failed bipap required intubation/no previous record on file since 2005.
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
## 2014-10-09 NOTE — Progress Notes (Signed)
INITIAL NUTRITION ASSESSMENT  DOCUMENTATION CODES Per approved criteria  -Severe malnutrition in the context of chronic illness -Underweight  Pt meets criteria for severe MALNUTRITION in the context of chronic illness as evidenced by severe muscle wasting and subcutaneous fat loss in multiple body regions.   INTERVENTION: Initiate Vital High Protein @ 20 ml/hr via OGT and increase by 10 ml every 8 hours to goal rate of 45 ml/hr.   Tube feeding regimen + propofol provides 1338 kcal (90% of needs), 93 grams of protein, and 903 ml of H2O.   Monitor refeeding labs (Phos/K/Mg) as pt poses high risk for refeeding d/t underweight status, severe muscle wasting/fat loss, and likely sub-optimal nutrient intake prior to admit  NUTRITION DIAGNOSIS: Inadequate oral intake related to inability to eat as evidenced by NPO status.   Goal: TF to meet >/= 90% of their estimated nutrition needs    Monitor:  TF tolerance, total protein/energy intake, labs, weights  Reason for Assessment: TF Consult/MST/Underweight/[employer]  48 y.o. male  Admitting Dx: <principal problem not specified>  ASSESSMENT: 48 y.o. M who is a quadriplegic [facility] with cough, congestion, SOB. In ED, remained hypoxic despite 6L O2, CXR revealed LLL PNA. Started on BiPAP but ultimately failed and required intubation.  -No family present in room to provide additional food/nutrition hx -Adult enteral protocol ordered. RN setting up Vital High Protein during time of RD assessment. Discussed concern for refeeding risk and conservative advancement rate; RN in agreement and verbalized understanding. RN noted pt will likely continue to require current level of propofol d/t restlessness and agitation -Quadriplegia contributing to muscle wasting and fat loss Nutrition Focused Physical Exam:  Subcutaneous Fat:  Orbital Region: severe wasting Upper Arm Region: severe Thoracic and Lumbar Region: n/a  Muscle:  Temple  Region: severe wasting Clavicle Bone Region: severe Clavicle and Acromion Bone Region: severe Scapular Bone Region: n/a Dorsal Hand: n/a Patellar Region: severe Anterior Thigh Region: severe Posterior Calf Region: severe  Edema: none noted  Patient is currently intubated on ventilator support MV: 8.6 L/min Temp (24hrs), Avg:96.4 F (35.8 C), Min:91.9 F (33.3 C), Max:98.8 F (37.1 C)  Propofol: 9.8 ml/hr; providing 258 lipid based kcal daily   Height: Ht Readings from Last 1 Encounters:  10/09/14 5\' 8"  (1.727 m)    Weight: Wt Readings from Last 1 Encounters:  10/09/14 94 lb 2.2 oz (42.7 kg)    Ideal Body Weight: 154 lb  % Ideal Body Weight: 61%  Wt Readings from Last 10 Encounters:  10/09/14 94 lb 2.2 oz (42.7 kg)    Usual Body Weight: unable to determine  % Usual Body Weight: unable to determine  BMI:  Body mass index is 14.32 kg/(m^2). Underweight  Estimated Nutritional Needs: Kcal: 1481 Protein: >/= 85 gram Fluid: >/= 1500 ml daily  Skin: WDL  Diet Order: Diet NPO time specified  EDUCATION NEEDS: -No education needs identified at this time   Intake/Output Summary (Last 24 hours) at 10/09/14 0929 Last data filed at 10/09/14 0907  Gross per 24 hour  Intake 657.45 ml  Output    515 ml  Net 142.45 ml    Last BM: WDL   Labs:   Recent Labs Lab 10/08/14 1941 10/09/14 0340  NA 139 139  K 3.3* 3.2*  CL 88* 89*  CO2 38* 36*  BUN 8 9  CREATININE 0.30* 0.26*  CALCIUM 9.7 9.6  MG  --  1.8  PHOS  --  2.3  GLUCOSE 136*  117*    CBG (last 3)   Recent Labs  10/08/14 2341  GLUCAP 189*    Scheduled Meds: . antiseptic oral rinse  7 mL Mouth Rinse QID  . azithromycin  500 mg Intravenous Q24H  . cefTRIAXone (ROCEPHIN)  IV  1 g Intravenous Q24H  . chlorhexidine  15 mL Mouth Rinse BID  . feeding supplement (VITAL HIGH PROTEIN)  1,000 mL Per Tube Q24H  . heparin  5,000 Units Subcutaneous 3 times per day  . lidocaine (cardiac) 100 mg/75ml       . pantoprazole (PROTONIX) IV  40 mg Intravenous QHS  . potassium chloride  10 mEq Intravenous Q1 Hr x 4  . rocuronium        Continuous Infusions: . sodium chloride 50 mL/hr at 10/09/14 0700  . fentaNYL infusion INTRAVENOUS 200 mcg/hr (10/09/14 0700)  . propofol 30.025 mcg/kg/min (10/09/14 0700)    Past Medical History  Diagnosis Date  . Quadriplegia   . Anxiety   . Tobacco dependence   . Chronic neck pain   . Chronic shoulder pain   . Spinal cord injury, C5-C7     History reviewed. No pertinent past surgical history.  Lloyd HugerSarah F Kanen Mottola MS RD LDN Clinical Dietitian Pager:[phone]

## 2014-10-10 ENCOUNTER — Inpatient Hospital Stay (HOSPITAL_COMMUNITY): Payer: Medicare Other

## 2014-10-10 DIAGNOSIS — E43 Unspecified severe protein-calorie malnutrition: Secondary | ICD-10-CM | POA: Diagnosis present

## 2014-10-10 LAB — BASIC METABOLIC PANEL
Anion gap: 9 (ref 5–15)
BUN: 15 mg/dL (ref 6–23)
CO2: 30 mEq/L (ref 19–32)
Calcium: 8.6 mg/dL (ref 8.4–10.5)
Chloride: 100 mEq/L (ref 96–112)
Creatinine, Ser: 0.37 mg/dL — ABNORMAL LOW (ref 0.50–1.35)
GFR calc Af Amer: >90 mL/min (ref >90)
GFR calc non Af Amer: >90 mL/min (ref >90)
GLUCOSE: 166 mg/dL — AB (ref 70–99)
Potassium: 3.2 mEq/L — ABNORMAL LOW (ref 3.7–5.3)
Sodium: 139 mEq/L (ref 137–147)

## 2014-10-10 LAB — CBC
HEMATOCRIT: 48.1 % (ref 39.0–52.0)
Hemoglobin: 15.8 g/dL (ref 13.0–17.0)
MCH: 31.6 pg (ref 26.0–34.0)
MCHC: 32.8 g/dL (ref 30.0–36.0)
MCV: 96.2 fL (ref 78.0–100.0)
Platelets: 180 10*3/uL (ref 150–400)
RBC: 5 MIL/uL (ref 4.22–5.81)
RDW: 13.3 % (ref 11.5–15.5)
WBC: 15.1 10*3/uL — ABNORMAL HIGH (ref 4.0–10.5)

## 2014-10-10 LAB — INFLUENZA PANEL BY PCR (TYPE A & B)
H1N1FLUPCR: NOT DETECTED
INFLAPCR: NEGATIVE
Influenza B By PCR: NEGATIVE

## 2014-10-10 LAB — MAGNESIUM: Magnesium: 1.6 mg/dL (ref 1.5–2.5)

## 2014-10-10 LAB — GLUCOSE, CAPILLARY
GLUCOSE-CAPILLARY: 140 mg/dL — AB (ref 70–99)
GLUCOSE-CAPILLARY: 107 mg/dL — AB (ref 70–99)
Glucose-Capillary: 142 mg/dL — ABNORMAL HIGH (ref 70–99)
Glucose-Capillary: 116 mg/dL — ABNORMAL HIGH (ref 70–99)
Glucose-Capillary: 94 mg/dL (ref 70–99)

## 2014-10-10 MED ORDER — POTASSIUM CHLORIDE 20 MEQ/15ML (10%) PO SOLN
40.0000 meq | Freq: Once | ORAL | Status: AC
  Administered 2014-10-10: 40 meq
  Filled 2014-10-10: qty 30

## 2014-10-10 MED ORDER — ARFORMOTEROL TARTRATE 15 MCG/2ML IN NEBU
15.0000 ug | INHALATION_SOLUTION | Freq: Two times a day (BID) | RESPIRATORY_TRACT | Status: DC
  Administered 2014-10-10 – 2014-10-21 (×23): 15 ug via RESPIRATORY_TRACT
  Filled 2014-10-10 (×24): qty 2

## 2014-10-10 MED ORDER — VITAL HIGH PROTEIN PO LIQD: 1000.0000 mL | ORAL | Status: DC

## 2014-10-10 MED ORDER — BUDESONIDE 0.25 MG/2ML IN SUSP
0.5000 mg | Freq: Two times a day (BID) | RESPIRATORY_TRACT | Status: DC
  Administered 2014-10-10: 0.5 mg via RESPIRATORY_TRACT
  Filled 2014-10-10 (×2): qty 4

## 2014-10-10 MED ORDER — VITAL HIGH PROTEIN PO LIQD
1000.0000 mL | ORAL | Status: DC
  Administered 2014-10-11 – 2014-10-13 (×3): 1000 mL
  Filled 2014-10-10 (×8): qty 1000

## 2014-10-10 MED ORDER — MIDAZOLAM HCL 2 MG/2ML IJ SOLN
1.0000 mg | INTRAMUSCULAR | Status: DC | PRN
  Administered 2014-10-10 – 2014-10-13 (×6): 2 mg via INTRAVENOUS
  Administered 2014-10-16 (×2): 1 mg via INTRAVENOUS
  Filled 2014-10-10 (×9): qty 2

## 2014-10-10 MED ORDER — ALBUTEROL SULFATE (2.5 MG/3ML) 0.083% IN NEBU
2.5000 mg | INHALATION_SOLUTION | RESPIRATORY_TRACT | Status: DC | PRN
  Administered 2014-10-15 – 2014-10-16 (×5): 2.5 mg via RESPIRATORY_TRACT
  Filled 2014-10-10 (×5): qty 3

## 2014-10-10 MED ORDER — BUDESONIDE 0.5 MG/2ML IN SUSP
0.5000 mg | Freq: Two times a day (BID) | RESPIRATORY_TRACT | Status: DC
  Administered 2014-10-10 – 2014-10-24 (×28): 0.5 mg via RESPIRATORY_TRACT
  Filled 2014-10-10 (×28): qty 2

## 2014-10-10 MED ORDER — MIDAZOLAM HCL 2 MG/2ML IJ SOLN
INTRAMUSCULAR | Status: AC
  Administered 2014-10-10: 2 mg
  Filled 2014-10-10: qty 2

## 2014-10-10 NOTE — Progress Notes (Signed)
Wasted Fentanyl from bag - 9 mL. Witnessed by Guinevere ScarleteLisa Cobb, RN.

## 2014-10-10 NOTE — Plan of Care (Signed)
Problem: Consults Goal: Ventilated Patients Patient Education See Patient Education Module for education specifics.  Outcome: Completed/Met Date Met:  10/10/14 Goal: Skin Care Protocol Initiated - if Braden Score 18 or less If consults are not indicated, leave blank or document N/A  Outcome: Completed/Met Date Met:  10/10/14 Goal: Nutrition Consult-if indicated Outcome: Completed/Met Date Met:  10/10/14 Goal: Diabetes Guidelines if Diabetic/Glucose > 140 If diabetic or lab glucose is > 140 mg/dl - Initiate Diabetes/Hyperglycemia Guidelines & Document Interventions  Outcome: Not Applicable Date Met:  32/76/14  Problem: Phase I Progression Outcomes Goal: Initiate hyperglycemia protocol as indicated Outcome: Not Applicable Date Met:  70/92/95 Goal: ARDS Protocol initiated if indicated Outcome: Completed/Met Date Met:  10/10/14 Goal: Sepsis Protocol initiated if indicated Outcome: Not Applicable Date Met:  74/73/40 Goal: Pain controlled with appropriate interventions Outcome: Completed/Met Date Met:  10/10/14 Goal: Patient tolerating nututrition at goal Outcome: Completed/Met Date Met:  10/10/14
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
## 2014-10-10 NOTE — Progress Notes (Addendum)
NUTRITION FOLLOW UP  Intervention:   -Initiate Vital AF 1.2 at 20 ml/hr and advance 10 ml every 4 hours to goal rate of 50 ml/hr -TF regimen + propofol will provide 1611 kcal (100% est kcal needs), and 90 gram protein (100% est protein needs), 973 ml free water -RD to monitor propofol dosage and adjust goal rate as warranted  Nutrition Dx:   Inadequate oral intake related to inability to eat as evidenced by NPO status; ongoing  Goal:   TF to meet >/= 90% of their estimated nutrition needs, progressing   Monitor:   TF tolerance, total protein/energy intake, labs, weights, skin integrity  Assessment:   12/01: No family present in room to provide additional food/nutrition hx -Adult enteral protocol ordered. RN setting up Vital High Protein during time of RD assessment. Discussed concern for refeeding risk and conservative advancement rate; RN in agreement and verbalized understanding. RN noted pt will likely continue to require current level of propofol d/t restlessness and agitation -Quadriplegia contributing to muscle wasting and fat loss  12/02: -Vital High Protein infusing at goal rate of 45 ml/hr -Propofol running at slightly lower rate of  8.2 ml/hr, providing 215 kcal/daily -Will continue to monitor to adjust need of TF goal rate -Phos/Mg WNL -Minimal residuals noted, < 10 ml -Wt increased 10 lb since admit, likely r/t to fluid at pt +3L  12/09: -RD discussed pt with RD on 12/09. Pt had been tolerating Vital High Protein at goal rate of 45 ml/hr w/out residuals. Was held for trial of extubation. RN noted pt is cared for by his mother pta, and had recently developed skin breakdown area on sacrum -WOC eval on 12/09 indicated pt with deep tissue injury on sacrum that will likely reveal itself to be a full thickness pressure ulcer -Pt failed extubation. RD received consult to restart TF -Pt + 3 L -CBG elevated but within goal of < 150 mg/dL -Patient is currently intubated on  ventilator support MV: 5.8 L/min Temp (24hrs), Avg:100.1 F (37.8 C), Min:98.1 F (36.7 C), Max:101.3 F (38.5 C)  Propofol: 6.5 ml/hr - providing 172 lipid based kcal daily   Height: Ht Readings from Last 1 Encounters:  10/10/14 5\' 8"  (1.727 m)    Weight Status:   Wt Readings from Last 1 Encounters:  10/10/14 104 lb 15 oz (47.6 kg)  10/09/14 94 lb  Re-estimated needs:  Kcal: 1615 Protein: 85-95 gram Fluid: >/= 1500 ml daily  Skin: stg 2 pressure ulcer on sacrum  Diet Order: Diet NPO time specified   Intake/Output Summary (Last 24 hours) at 10/10/14 1241 Last data filed at 10/10/14 1200  Gross per 24 hour  Intake 3835.76 ml  Output    570 ml  Net 3265.76 ml    Last BM: 12/08   Labs:   Recent Labs Lab 10/08/14 1941 10/09/14 0340 10/10/14 0635  NA 139 139 139  K 3.3* 3.2* 3.2*  CL 88* 89* 100  CO2 38* 36* 30  BUN 8 9 15   CREATININE 0.30* 0.26* 0.37*  CALCIUM 9.7 9.6 8.6  MG  --  1.8 1.6  PHOS  --  2.3  --   GLUCOSE 136* 117* 166*    CBG (last 3)   Recent Labs  10/10/14 0551 10/10/14 0801 10/10/14 1233  GLUCAP 142* 116* 107*    Scheduled Meds: . antiseptic oral rinse  7 mL Mouth Rinse QID  . arformoterol  15 mcg Nebulization BID  . azithromycin  500 mg Intravenous  Q24H  . budesonide  0.5 mg Nebulization BID  . cefTRIAXone (ROCEPHIN)  IV  1 g Intravenous Q24H  . chlorhexidine  15 mL Mouth Rinse BID  . [START ON 10/11/2014] feeding supplement (VITAL HIGH PROTEIN)  1,000 mL Per Tube Q24H  . heparin  5,000 Units Subcutaneous 3 times per day  . pantoprazole (PROTONIX) IV  40 mg Intravenous QHS    Continuous Infusions: . fentaNYL infusion INTRAVENOUS 250 mcg/hr (10/10/14 1225)  . propofol 30 mcg/kg/min (10/10/14 1226)    Lloyd HugerSarah F Miaisabella Bacorn MS RD LDN Clinical Dietitian Pager:[phone]

## 2014-10-11 ENCOUNTER — Inpatient Hospital Stay (HOSPITAL_COMMUNITY): Payer: Medicare Other

## 2014-10-11 DIAGNOSIS — Z720 Tobacco use: Secondary | ICD-10-CM

## 2014-10-11 LAB — CBC WITH DIFFERENTIAL/PLATELET
Basophils Absolute: 0 10*3/uL (ref 0.0–0.1)
Basophils Relative: 0 % (ref 0–1)
EOS ABS: 0.1 10*3/uL (ref 0.0–0.7)
Eosinophils Relative: 1 % (ref 0–5)
HCT: 43.3 % (ref 39.0–52.0)
HEMOGLOBIN: 14.3 g/dL (ref 13.0–17.0)
LYMPHS ABS: 1.2 10*3/uL (ref 0.7–4.0)
Lymphocytes Relative: 13 % (ref 12–46)
MCH: 31.5 pg (ref 26.0–34.0)
MCHC: 33 g/dL (ref 30.0–36.0)
MCV: 95.4 fL (ref 78.0–100.0)
MONOS PCT: 10 % (ref 3–12)
Monocytes Absolute: 1 10*3/uL (ref 0.1–1.0)
Neutro Abs: 7.5 10*3/uL (ref 1.7–7.7)
Neutrophils Relative %: 76 % (ref 43–77)
PLATELETS: 167 10*3/uL (ref 150–400)
RBC: 4.54 MIL/uL (ref 4.22–5.81)
RDW: 13.4 % (ref 11.5–15.5)
WBC: 9.8 10*3/uL (ref 4.0–10.5)

## 2014-10-11 LAB — GLUCOSE, CAPILLARY
GLUCOSE-CAPILLARY: 103 mg/dL — AB (ref 70–99)
GLUCOSE-CAPILLARY: 116 mg/dL — AB (ref 70–99)
GLUCOSE-CAPILLARY: 94 mg/dL (ref 70–99)
Glucose-Capillary: 102 mg/dL — ABNORMAL HIGH (ref 70–99)
Glucose-Capillary: 93 mg/dL (ref 70–99)
Glucose-Capillary: 108 mg/dL — ABNORMAL HIGH (ref 70–99)
Glucose-Capillary: 81 mg/dL (ref 70–99)

## 2014-10-11 LAB — BASIC METABOLIC PANEL
Anion gap: 10 (ref 5–15)
BUN: 17 mg/dL (ref 6–23)
CALCIUM: 8.4 mg/dL (ref 8.4–10.5)
CO2: 30 mEq/L (ref 19–32)
CREATININE: 0.34 mg/dL — AB (ref 0.50–1.35)
Chloride: 99 mEq/L (ref 96–112)
Glucose, Bld: 101 mg/dL — ABNORMAL HIGH (ref 70–99)
POTASSIUM: 3.7 meq/L (ref 3.7–5.3)
Sodium: 139 mEq/L (ref 137–147)

## 2014-10-11 LAB — CULTURE, RESPIRATORY

## 2014-10-11 LAB — CULTURE, RESPIRATORY W GRAM STAIN

## 2014-10-11 MED ORDER — CLONAZEPAM 0.5 MG PO TBDP
1.0000 mg | ORAL_TABLET | Freq: Two times a day (BID) | ORAL | Status: DC
  Administered 2014-10-11 – 2014-10-20 (×20): 1 mg via ORAL
  Filled 2014-10-11 (×19): qty 2

## 2014-10-11 MED ORDER — CLONAZEPAM 0.1 MG/ML ORAL SUSPENSION
1.0000 mg | Freq: Two times a day (BID) | ORAL | Status: DC
  Filled 2014-10-11 (×2): qty 10

## 2014-10-11 MED ORDER — GUAIFENESIN 100 MG/5ML PO SOLN
10.0000 mL | Freq: Two times a day (BID) | ORAL | Status: DC
  Administered 2014-10-11 – 2014-10-24 (×27): 200 mg via ORAL
  Filled 2014-10-11 (×11): qty 10
  Filled 2014-10-11: qty 20
  Filled 2014-10-11 (×14): qty 10

## 2014-10-11 NOTE — Progress Notes (Signed)
CSW received referral stating that pt mother wanting to review pt home needs.  Inappropriate CSW referral. CSW notified RNCM of referral.   CSW signing off.   Please re-consult if social work needs arise.  Loletta SpecterSuzanna Arya Boxley, MSW, LCSW Clinical Social Work [phone]
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
## 2014-10-11 NOTE — Progress Notes (Signed)
Wasted Fentanyl from bag 15 mL- witnessed by Sara Leeiffany Banks.RN.

## 2014-10-11 NOTE — Progress Notes (Signed)
Patient lavaged with 3cc NS and bagged with 100% O2. Suctioned for a large amt. of thick pale yellow secretions. Tolerated well.

## 2014-10-11 NOTE — Progress Notes (Signed)
Wasted in sink - fentanyl from bag. 20 mL. Witnessed by Brandon SaneMackenzie Montgomery.

## 2014-10-12 LAB — CBC WITH DIFFERENTIAL/PLATELET
BASOS PCT: 0 % (ref 0–1)
Basophils Absolute: 0 10*3/uL (ref 0.0–0.1)
Eosinophils Absolute: 0.1 10*3/uL (ref 0.0–0.7)
Eosinophils Relative: 2 % (ref 0–5)
HCT: 40.5 % (ref 39.0–52.0)
Hemoglobin: 13.4 g/dL (ref 13.0–17.0)
Lymphocytes Relative: 14 % (ref 12–46)
Lymphs Abs: 1.1 10*3/uL (ref 0.7–4.0)
MCH: 31.2 pg (ref 26.0–34.0)
MCHC: 33.1 g/dL (ref 30.0–36.0)
MCV: 94.2 fL (ref 78.0–100.0)
Monocytes Absolute: 0.7 10*3/uL (ref 0.1–1.0)
Monocytes Relative: 9 % (ref 3–12)
NEUTROS ABS: 5.8 10*3/uL (ref 1.7–7.7)
NEUTROS PCT: 75 % (ref 43–77)
Platelets: 188 10*3/uL (ref 150–400)
RBC: 4.3 MIL/uL (ref 4.22–5.81)
RDW: 13.7 % (ref 11.5–15.5)
WBC: 7.7 10*3/uL (ref 4.0–10.5)

## 2014-10-12 LAB — RESPIRATORY VIRUS PANEL
Adenovirus: NOT DETECTED
INFLUENZA A H3: NOT DETECTED
INFLUENZA B 1: NOT DETECTED
Influenza A H1: NOT DETECTED
Influenza A: NOT DETECTED
METAPNEUMOVIRUS: NOT DETECTED
Parainfluenza 1: NOT DETECTED
Parainfluenza 2: NOT DETECTED
Parainfluenza 3: NOT DETECTED
RESPIRATORY SYNCYTIAL VIRUS A: NOT DETECTED
Respiratory Syncytial Virus B: NOT DETECTED
Rhinovirus: NOT DETECTED

## 2014-10-12 LAB — BASIC METABOLIC PANEL
ANION GAP: 11 (ref 5–15)
BUN: 15 mg/dL (ref 6–23)
CALCIUM: 8.5 mg/dL (ref 8.4–10.5)
CO2: 31 mEq/L (ref 19–32)
CREATININE: 0.33 mg/dL — AB (ref 0.50–1.35)
Chloride: 96 mEq/L (ref 96–112)
GFR calc non Af Amer: >90 mL/min (ref >90)
Glucose, Bld: 92 mg/dL (ref 70–99)
Potassium: 3.5 mEq/L — ABNORMAL LOW (ref 3.7–5.3)
Sodium: 138 mEq/L (ref 137–147)

## 2014-10-12 LAB — GLUCOSE, CAPILLARY
GLUCOSE-CAPILLARY: 111 mg/dL — AB (ref 70–99)
GLUCOSE-CAPILLARY: 126 mg/dL — AB (ref 70–99)
GLUCOSE-CAPILLARY: 93 mg/dL (ref 70–99)
Glucose-Capillary: 115 mg/dL — ABNORMAL HIGH (ref 70–99)
Glucose-Capillary: 105 mg/dL — ABNORMAL HIGH (ref 70–99)
Glucose-Capillary: 118 mg/dL — ABNORMAL HIGH (ref 70–99)

## 2014-10-12 LAB — TRIGLYCERIDES: Triglycerides: 105 mg/dL (ref <150)

## 2014-10-12 MED ORDER — HALOPERIDOL LACTATE 5 MG/ML IJ SOLN
1.0000 mg | INTRAMUSCULAR | Status: DC | PRN
Start: 2014-10-12 — End: 2014-10-16

## 2014-10-12 MED ORDER — POTASSIUM CHLORIDE 20 MEQ/15ML (10%) PO SOLN
ORAL | Status: AC
  Filled 2014-10-12: qty 15

## 2014-10-12 MED ORDER — POTASSIUM CHLORIDE 20 MEQ/15ML (10%) PO SOLN
20.0000 meq | ORAL | Status: AC
  Administered 2014-10-12 (×2): 20 meq

## 2014-10-12 MED ORDER — MIDAZOLAM HCL 2 MG/2ML IJ SOLN
2.0000 mg | Freq: Once | INTRAMUSCULAR | Status: AC
  Administered 2014-10-12: 2 mg via INTRAVENOUS

## 2014-10-12 NOTE — Progress Notes (Signed)
[facility] FOR [facility] LAB REPLACEMENT ONLY  The patient does apply for the [facility]ly[facility] Protocol based on the criteria listed below:   1. Is GFR >/= 40 ml/min? Yes.    Patient's GFR today is >90 2. Is urine output >/= 0.5 ml/kg/hr for the last 6 hours? Yes.   Patient's UOP is 2 ml/kg/hr 3. Is BUN < 60 mg/dL? Yes.    Patient's BUN today is 15 4. Abnormal electrolyte K 3.5  5. Ordered repletion with: per protocol 6. If a panic level lab has been reported, has the CCM MD in charge been notified? Yes.  .   Physician:  E Deterding  Ardelle ParkSuits, Rainn Bullinger McEachran 10/12/2014 5:30 AM
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
## 2014-10-12 NOTE — Progress Notes (Signed)
eLink Physician-Brief Progress Note Patient Name: Brandon SchwalbeRobert L Montgomery DOB: 06/28/1966 MRN: [id number]   Date of Service  10/12/2014  HPI/Events of Note  Agitated on 50 mcg propofol/ 300 mcg fent/ clonazepam  eICU Interventions  2mg  versed Haldol prn Weaned well today - consider extubation in am if secretions better      Intervention Category Major Interventions: Delirium, psychosis, severe agitation - evaluation and management  ALVA,RAKESH V. 10/12/2014, 6:41 PM

## 2014-10-13 ENCOUNTER — Inpatient Hospital Stay (HOSPITAL_COMMUNITY): Payer: Medicare Other

## 2014-10-13 LAB — CBC
HEMATOCRIT: 40.1 % (ref 39.0–52.0)
Hemoglobin: 13 g/dL (ref 13.0–17.0)
MCH: 31 pg (ref 26.0–34.0)
MCHC: 32.4 g/dL (ref 30.0–36.0)
MCV: 95.5 fL (ref 78.0–100.0)
Platelets: 206 10*3/uL (ref 150–400)
RBC: 4.2 MIL/uL — ABNORMAL LOW (ref 4.22–5.81)
RDW: 14 % (ref 11.5–15.5)
WBC: 8.1 10*3/uL (ref 4.0–10.5)

## 2014-10-13 LAB — MAGNESIUM: MAGNESIUM: 1.7 mg/dL (ref 1.5–2.5)

## 2014-10-13 LAB — GLUCOSE, CAPILLARY
GLUCOSE-CAPILLARY: 117 mg/dL — AB (ref 70–99)
Glucose-Capillary: 99 mg/dL (ref 70–99)
Glucose-Capillary: 99 mg/dL (ref 70–99)
Glucose-Capillary: 98 mg/dL (ref 70–99)
Glucose-Capillary: 103 mg/dL — ABNORMAL HIGH (ref 70–99)
Glucose-Capillary: 92 mg/dL (ref 70–99)

## 2014-10-13 LAB — BASIC METABOLIC PANEL
ANION GAP: 10 (ref 5–15)
BUN: 12 mg/dL (ref 6–23)
CALCIUM: 8.7 mg/dL (ref 8.4–10.5)
CO2: 32 mEq/L (ref 19–32)
Chloride: 96 mEq/L (ref 96–112)
Creatinine, Ser: 0.35 mg/dL — ABNORMAL LOW (ref 0.50–1.35)
GFR calc Af Amer: >90 mL/min (ref >90)
Glucose, Bld: 103 mg/dL — ABNORMAL HIGH (ref 70–99)
Potassium: 4.1 mEq/L (ref 3.7–5.3)
Sodium: 138 mEq/L (ref 137–147)

## 2014-10-13 MED ORDER — GUAIFENESIN 100 MG/5ML PO SOLN
ORAL | Status: AC
  Administered 2014-10-13: 09:00:00
  Filled 2014-10-13: qty 10

## 2014-10-13 MED ORDER — FUROSEMIDE 10 MG/ML IJ SOLN
40.0000 mg | Freq: Three times a day (TID) | INTRAMUSCULAR | Status: AC
  Administered 2014-10-13 (×2): 40 mg via INTRAVENOUS
  Filled 2014-10-13 (×2): qty 4

## 2014-10-13 MED ORDER — CLONAZEPAM 1 MG PO TABS
ORAL_TABLET | ORAL | Status: AC
  Filled 2014-10-13: qty 1

## 2014-10-13 MED ORDER — MAGNESIUM SULFATE 2 GM/50ML IV SOLN
2.0000 g | Freq: Once | INTRAVENOUS | Status: AC
  Administered 2014-10-13: 2 g via INTRAVENOUS

## 2014-10-13 MED ORDER — MAGNESIUM SULFATE 2 GM/50ML IV SOLN
INTRAVENOUS | Status: AC
  Filled 2014-10-13: qty 50

## 2014-10-13 MED ORDER — POTASSIUM CHLORIDE CRYS ER 20 MEQ PO TBCR
40.0000 meq | EXTENDED_RELEASE_TABLET | Freq: Three times a day (TID) | ORAL | Status: AC
  Administered 2014-10-13 (×2): 40 meq via ORAL
  Filled 2014-10-13 (×2): qty 2

## 2014-10-13 MED ORDER — CHLORHEXIDINE GLUCONATE 0.12 % MT SOLN
OROMUCOSAL | Status: AC
  Administered 2014-10-13: 15 mL
  Filled 2014-10-13: qty 15
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
## 2014-10-13 NOTE — Plan of Care (Signed)
Problem: Phase I Progression Outcomes Goal: Baseline oxygen/pH stable Outcome: Progressing Goal: Initial discharge plan identified Outcome: Completed/Met Date Met:  10/13/14

## 2014-10-13 NOTE — Progress Notes (Signed)
[facility]OTOC[facility] FOR AM LAB REPLACEMENT ONLY  The patient does [facility] Protocol based on the criteria listed below:   1. Is GFR >/= 40 ml/min? Yes.    Patient's GFR today is >90 2. Is urine output >/= 0.5 ml/kg/hr for the last 6 hours? Yes.   Patient's UOP is 1.5 ml/kg/hr 3. Is BUN < 60 mg/dL? Yes.    Patient's BUN today is 12 4. Abnormal electrolyte Mg 1.7 5. Ordered repletion with: per protocol 6. If a panic level lab has been reported, has the CCM MD in charge been notified? Yes.  .   Physician:  Sharol RousselRamaswamy  Cloy Cozzens McEachran 10/13/2014 5:13 AM

## 2014-10-14 ENCOUNTER — Inpatient Hospital Stay (HOSPITAL_COMMUNITY): Payer: Medicare Other

## 2014-10-14 LAB — GLUCOSE, CAPILLARY
GLUCOSE-CAPILLARY: 115 mg/dL — AB (ref 70–99)
Glucose-Capillary: 105 mg/dL — ABNORMAL HIGH (ref 70–99)
Glucose-Capillary: 120 mg/dL — ABNORMAL HIGH (ref 70–99)
Glucose-Capillary: 111 mg/dL — ABNORMAL HIGH (ref 70–99)
Glucose-Capillary: 120 mg/dL — ABNORMAL HIGH (ref 70–99)
Glucose-Capillary: 125 mg/dL — ABNORMAL HIGH (ref 70–99)

## 2014-10-14 LAB — CBC
HCT: 46 % (ref 39.0–52.0)
Hemoglobin: 15.3 g/dL (ref 13.0–17.0)
MCH: 31.5 pg (ref 26.0–34.0)
MCHC: 33.3 g/dL (ref 30.0–36.0)
MCV: 94.8 fL (ref 78.0–100.0)
PLATELETS: 199 10*3/uL (ref 150–400)
RBC: 4.85 MIL/uL (ref 4.22–5.81)
RDW: 13.7 % (ref 11.5–15.5)
WBC: 14.1 10*3/uL — AB (ref 4.0–10.5)

## 2014-10-14 LAB — BLOOD GAS, ARTERIAL
Acid-Base Excess: 10.9 mmol/L — ABNORMAL HIGH (ref 0.0–2.0)
Bicarbonate: 35.7 mEq/L — ABNORMAL HIGH (ref 20.0–24.0)
Drawn by: 308601
FIO2: 0.4 %
O2 Saturation: 85.3 %
PEEP/CPAP: 5 cmH2O
PH ART: 7.493 — AB (ref 7.350–7.450)
Patient temperature: 37.2
RATE: 14 resp/min
TCO2: 30.4 mmol/L (ref 0–100)
VT: 410 mL
pCO2 arterial: 47.1 mmHg — ABNORMAL HIGH (ref 35.0–45.0)
pO2, Arterial: 49.6 mmHg — ABNORMAL LOW (ref 80.0–100.0)

## 2014-10-14 LAB — BASIC METABOLIC PANEL
Anion gap: 16 — ABNORMAL HIGH (ref 5–15)
BUN: 17 mg/dL (ref 6–23)
CALCIUM: 9.6 mg/dL (ref 8.4–10.5)
CO2: 31 meq/L (ref 19–32)
CREATININE: 0.38 mg/dL — AB (ref 0.50–1.35)
Chloride: 92 mEq/L — ABNORMAL LOW (ref 96–112)
GFR calc Af Amer: >90 mL/min (ref >90)
GFR calc non Af Amer: >90 mL/min (ref >90)
GLUCOSE: 113 mg/dL — AB (ref 70–99)
Potassium: 5.2 mEq/L (ref 3.7–5.3)
Sodium: 139 mEq/L (ref 137–147)

## 2014-10-14 LAB — MAGNESIUM: Magnesium: 1.9 mg/dL (ref 1.5–2.5)

## 2014-10-14 LAB — PHOSPHORUS: Phosphorus: 5.1 mg/dL — ABNORMAL HIGH (ref 2.3–4.6)

## 2014-10-14 MED ORDER — ACETAMINOPHEN 325 MG PO TABS
650.0000 mg | ORAL_TABLET | Freq: Once | ORAL | Status: AC
  Administered 2014-10-14: 650 mg
  Filled 2014-10-14: qty 2

## 2014-10-14 MED ORDER — FUROSEMIDE 10 MG/ML IJ SOLN
20.0000 mg | Freq: Four times a day (QID) | INTRAMUSCULAR | Status: AC
  Administered 2014-10-14 (×3): 20 mg via INTRAVENOUS
  Filled 2014-10-14 (×3): qty 2
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
## 2014-10-15 ENCOUNTER — Inpatient Hospital Stay (HOSPITAL_COMMUNITY): Payer: Medicare Other

## 2014-10-15 LAB — BASIC METABOLIC PANEL
Anion gap: 16 — ABNORMAL HIGH (ref 5–15)
BUN: 27 mg/dL — ABNORMAL HIGH (ref 6–23)
CALCIUM: 10.1 mg/dL (ref 8.4–10.5)
CHLORIDE: 94 meq/L — AB (ref 96–112)
CO2: 33 mEq/L — ABNORMAL HIGH (ref 19–32)
CREATININE: 0.36 mg/dL — AB (ref 0.50–1.35)
GFR calc non Af Amer: >90 mL/min (ref >90)
Glucose, Bld: 148 mg/dL — ABNORMAL HIGH (ref 70–99)
Potassium: 4 mEq/L (ref 3.7–5.3)
Sodium: 143 mEq/L (ref 137–147)

## 2014-10-15 LAB — CBC
HCT: 48.8 % (ref 39.0–52.0)
Hemoglobin: 15.8 g/dL (ref 13.0–17.0)
MCH: 31.4 pg (ref 26.0–34.0)
MCHC: 32.4 g/dL (ref 30.0–36.0)
MCV: 97 fL (ref 78.0–100.0)
PLATELETS: 239 10*3/uL (ref 150–400)
RBC: 5.03 MIL/uL (ref 4.22–5.81)
RDW: 14 % (ref 11.5–15.5)
WBC: 15.5 10*3/uL — AB (ref 4.0–10.5)

## 2014-10-15 LAB — BLOOD GAS, ARTERIAL
Acid-Base Excess: 8.9 mmol/L — ABNORMAL HIGH (ref 0.0–2.0)
Bicarbonate: 36 mEq/L — ABNORMAL HIGH (ref 20.0–24.0)
DRAWN BY: 308601
FIO2: 0.5 %
LHR: 14 {breaths}/min
MECHVT: 410 mL
O2 Saturation: 90.6 %
PCO2 ART: 61 mmHg — AB (ref 35.0–45.0)
PEEP/CPAP: 5 cmH2O
Patient temperature: 37.5
TCO2: 31 mmol/L (ref 0–100)
pH, Arterial: 7.391 (ref 7.350–7.450)
pO2, Arterial: 65 mmHg — ABNORMAL LOW (ref 80.0–100.0)

## 2014-10-15 LAB — GLUCOSE, CAPILLARY
GLUCOSE-CAPILLARY: 138 mg/dL — AB (ref 70–99)
GLUCOSE-CAPILLARY: 132 mg/dL — AB (ref 70–99)
Glucose-Capillary: 133 mg/dL — ABNORMAL HIGH (ref 70–99)
Glucose-Capillary: 148 mg/dL — ABNORMAL HIGH (ref 70–99)
Glucose-Capillary: 131 mg/dL — ABNORMAL HIGH (ref 70–99)
Glucose-Capillary: 117 mg/dL — ABNORMAL HIGH (ref 70–99)

## 2014-10-15 LAB — TRIGLYCERIDES: Triglycerides: 135 mg/dL (ref <150)

## 2014-10-15 LAB — MAGNESIUM: Magnesium: 2 mg/dL (ref 1.5–2.5)

## 2014-10-15 LAB — PHOSPHORUS: Phosphorus: 5.8 mg/dL — ABNORMAL HIGH (ref 2.3–4.6)

## 2014-10-15 MED ORDER — PANTOPRAZOLE SODIUM 40 MG PO PACK
40.0000 mg | PACK | Freq: Every day | ORAL | Status: DC
  Administered 2014-10-15 – 2014-10-20 (×6): 40 mg
  Filled 2014-10-15 (×9): qty 20

## 2014-10-15 MED ORDER — ACETYLCYSTEINE 20 % IN SOLN
3.0000 mL | Freq: Three times a day (TID) | RESPIRATORY_TRACT | Status: AC
  Administered 2014-10-15: 3 mL via RESPIRATORY_TRACT
  Administered 2014-10-15 (×2): via RESPIRATORY_TRACT
  Filled 2014-10-15 (×3): qty 4

## 2014-10-15 MED ORDER — DOCUSATE SODIUM 50 MG/5ML PO LIQD
200.0000 mg | Freq: Every day | ORAL | Status: DC
  Administered 2014-10-15 – 2014-10-19 (×3): 200 mg
  Filled 2014-10-15 (×7): qty 20

## 2014-10-15 NOTE — Progress Notes (Signed)
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  In ED, remained hypoxic despite 6L O2, CXR revealed LLL PNA.  Started on BiPAP but ultimately failed and required intubation.     Action/Plan:   TBD poss home once resp status stable/lives with parent.   Anticipated DC Date:  10/18/2014   Anticipated DC Plan:  HOME W HOME HEALTH SERVICES  In-house referral  NA      DC Planning Services  CM consult      [facility]  [facility]   Choice offered to / List presented to:  NA   DME arranged  NA      DME agency  NA     HH arranged  NA      HH agency  NA   Status of service:  In process, will continue to follow Medicare Important Message given?   (If response is "NO", the following Medicare IM given date fields will be blank) Date Medicare IM given:   Medicare IM given by:   Date Additional Medicare IM given:   Additional Medicare IM given by:    Discharge Disposition:    Per UR Regulation:  Reviewed for med. necessity/level of care/duration of stay  If discussed at Long Length of Stay Meetings, dates discussed:    Comments:  [id number] and 10/09/2014/Brandon Reddy Montgomery. Earlene Plateravis, RN, BSN, CCM: Chart review for medical necessity and patient discharge needs. Case Manager will follow for patient condition changes. 09/21/2014/Brandon Laufer Montgomery. Earlene Plateravis, RN, BSN, CCM: CHART NOTE FOR PROGRESSION: failed bipap required intubation/no previous record on file since 2005.

## 2014-10-15 NOTE — Progress Notes (Signed)
Found ETT at the 20cm mark with the need to change tube holder. CXR this am with ETT in good position but am not sure if the ETT was at the 20 or 24cm position. Adjusted ETT at the 22cm and CXR ordered for tube placement.BBS somewhat decreased on left, good volume return. Will await CXR report for need to adjust ETT.
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
## 2014-10-15 NOTE — Progress Notes (Signed)
MD notified of lack of U/O this am. Will continue to assess.

## 2014-10-16 ENCOUNTER — Inpatient Hospital Stay (HOSPITAL_COMMUNITY): Payer: Medicare Other

## 2014-10-16 DIAGNOSIS — J189 Pneumonia, unspecified organism: Secondary | ICD-10-CM

## 2014-10-16 LAB — GLUCOSE, CAPILLARY
Glucose-Capillary: 106 mg/dL — ABNORMAL HIGH (ref 70–99)
Glucose-Capillary: 113 mg/dL — ABNORMAL HIGH (ref 70–99)
Glucose-Capillary: 123 mg/dL — ABNORMAL HIGH (ref 70–99)
Glucose-Capillary: 123 mg/dL — ABNORMAL HIGH (ref 70–99)
Glucose-Capillary: 126 mg/dL — ABNORMAL HIGH (ref 70–99)
Glucose-Capillary: 143 mg/dL — ABNORMAL HIGH (ref 70–99)
Glucose-Capillary: 99 mg/dL (ref 70–99)

## 2014-10-16 LAB — BLOOD GAS, ARTERIAL
ACID-BASE EXCESS: 8.1 mmol/L — AB (ref 0.0–2.0)
Acid-Base Excess: 4.6 mmol/L — ABNORMAL HIGH (ref 0.0–2.0)
Bicarbonate: 35.1 mEq/L — ABNORMAL HIGH (ref 20.0–24.0)
Bicarbonate: 33.1 mEq/L — ABNORMAL HIGH (ref 20.0–24.0)
DRAWN BY: 331471
Drawn by: 244901
FIO2: 0.5 %
FIO2: 1 %
LHR: 14 {breaths}/min
MECHVT: 410 mL
MODE: POSITIVE
O2 SAT: 89.6 %
O2 SAT: 99.1 %
PEEP/CPAP: 5 cmH2O
PEEP/CPAP: 10 cmH2O
PO2 ART: 57.7 mmHg — AB (ref 80.0–100.0)
Patient temperature: 36.1
Patient temperature: 98.6
Pressure support: 5 cmH2O
TCO2: 30.4 mmol/L (ref 0–100)
TCO2: 28.9 mmol/L (ref 0–100)
pCO2 arterial: 57 mmHg — ABNORMAL HIGH (ref 35.0–45.0)
pCO2 arterial: 66.9 mmHg (ref 35.0–45.0)
pH, Arterial: 7.402 (ref 7.350–7.450)
pH, Arterial: 7.314 — ABNORMAL LOW (ref 7.350–7.450)
pO2, Arterial: 196 mmHg — ABNORMAL HIGH (ref 80.0–100.0)

## 2014-10-16 LAB — BASIC METABOLIC PANEL
ANION GAP: 13 (ref 5–15)
BUN: 28 mg/dL — ABNORMAL HIGH (ref 6–23)
CO2: 34 meq/L — AB (ref 19–32)
Calcium: 10.1 mg/dL (ref 8.4–10.5)
Chloride: 93 mEq/L — ABNORMAL LOW (ref 96–112)
Creatinine, Ser: 0.34 mg/dL — ABNORMAL LOW (ref 0.50–1.35)
GFR calc Af Amer: >90 mL/min (ref >90)
Glucose, Bld: 121 mg/dL — ABNORMAL HIGH (ref 70–99)
Potassium: 3.6 mEq/L — ABNORMAL LOW (ref 3.7–5.3)
SODIUM: 140 meq/L (ref 137–147)

## 2014-10-16 LAB — CBC
HCT: 47.6 % (ref 39.0–52.0)
Hemoglobin: 14.7 g/dL (ref 13.0–17.0)
MCH: 29.8 pg (ref 26.0–34.0)
MCHC: 30.9 g/dL (ref 30.0–36.0)
MCV: 96.6 fL (ref 78.0–100.0)
Platelets: 299 10*3/uL (ref 150–400)
RBC: 4.93 MIL/uL (ref 4.22–5.81)
RDW: 13.8 % (ref 11.5–15.5)
WBC: 15.5 10*3/uL — AB (ref 4.0–10.5)

## 2014-10-16 LAB — TRIGLYCERIDES: Triglycerides: 153 mg/dL — ABNORMAL HIGH (ref <150)

## 2014-10-16 MED ORDER — PROPOFOL 10 MG/ML IV EMUL
0.0000 ug/kg/min | INTRAVENOUS | Status: DC
  Administered 2014-10-16: 45 ug/kg/min via INTRAVENOUS
  Administered 2014-10-17: 35 ug/kg/min via INTRAVENOUS
  Administered 2014-10-17: 40 ug/kg/min via INTRAVENOUS
  Administered 2014-10-17 (×2): 50 ug/kg/min via INTRAVENOUS
  Administered 2014-10-18: 40 ug/kg/min via INTRAVENOUS
  Administered 2014-10-18: 50 ug/kg/min via INTRAVENOUS
  Administered 2014-10-18: 40 ug/kg/min via INTRAVENOUS
  Administered 2014-10-19: 50 ug/kg/min via INTRAVENOUS
  Filled 2014-10-16 (×10): qty 100

## 2014-10-16 MED ORDER — SODIUM CHLORIDE 0.9 % IV SOLN
INTRAVENOUS | Status: DC
  Administered 2014-10-16 – 2014-10-22 (×2): via INTRAVENOUS

## 2014-10-16 MED ORDER — SODIUM CHLORIDE 0.9 % IV SOLN
25.0000 ug/h | INTRAVENOUS | Status: DC
  Administered 2014-10-16 – 2014-10-19 (×3): 100 ug/h via INTRAVENOUS
  Administered 2014-10-19 – 2014-10-20 (×2): 300 ug/h via INTRAVENOUS
  Administered 2014-10-20: 350 ug/h via INTRAVENOUS
  Filled 2014-10-16 (×6): qty 50

## 2014-10-16 MED ORDER — LORAZEPAM 2 MG/ML IJ SOLN
0.5000 mg | INTRAMUSCULAR | Status: DC | PRN
  Administered 2014-10-19 – 2014-10-21 (×3): 0.5 mg via INTRAVENOUS
  Filled 2014-10-16 (×3): qty 1

## 2014-10-16 MED ORDER — POTASSIUM CHLORIDE 20 MEQ/15ML (10%) PO SOLN
20.0000 meq | ORAL | Status: AC
  Administered 2014-10-16 (×2): 20 meq
  Filled 2014-10-16 (×2): qty 15

## 2014-10-16 MED ORDER — FENTANYL CITRATE 0.05 MG/ML IJ SOLN: 12.5000 ug | INTRAMUSCULAR | Status: DC | PRN

## 2014-10-16 MED ORDER — FENTANYL CITRATE 0.05 MG/ML IJ SOLN: 100.0000 ug | INTRAMUSCULAR | Status: DC | PRN

## 2014-10-16 MED ORDER — FENTANYL CITRATE 0.05 MG/ML IJ SOLN
INTRAMUSCULAR | Status: DC
Start: 2014-10-16 — End: 2014-10-16
  Filled 2014-10-16: qty 2

## 2014-10-16 MED ORDER — CETYLPYRIDINIUM CHLORIDE 0.05 % MT LIQD
7.0000 mL | Freq: Four times a day (QID) | OROMUCOSAL | Status: DC
  Administered 2014-10-16 – 2014-10-24 (×29): 7 mL via OROMUCOSAL

## 2014-10-16 MED ORDER — FENTANYL CITRATE 0.05 MG/ML IJ SOLN: 50.0000 ug | Freq: Once | INTRAMUSCULAR | Status: DC

## 2014-10-16 MED ORDER — FENTANYL BOLUS VIA INFUSION
50.0000 ug | INTRAVENOUS | Status: DC | PRN
  Administered 2014-10-16 – 2014-10-20 (×6): 50 ug via INTRAVENOUS
  Filled 2014-10-16: qty 50

## 2014-10-16 MED ORDER — MIDAZOLAM HCL 2 MG/2ML IJ SOLN
INTRAMUSCULAR | Status: AC
  Filled 2014-10-16: qty 2

## 2014-10-16 MED ORDER — CHLORHEXIDINE GLUCONATE 0.12 % MT SOLN
15.0000 mL | Freq: Two times a day (BID) | OROMUCOSAL | Status: DC
  Administered 2014-10-16 – 2014-10-24 (×16): 15 mL via OROMUCOSAL
  Filled 2014-10-16 (×16): qty 15

## 2014-10-16 NOTE — Progress Notes (Signed)
Long discussion w/ sisters. They are concerned that caring for Mr Brandon Montgomery may be more than she can handle. She is 48 years old w/ multiple health problems.  Plan We will order social work consult re: possible SNF placement   Brandon MartinetPeter E Nirvi Montgomery ACNP-BC [facility]/Critical Care Pager # [phone] OR # [phone] if no answer
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
## 2014-10-16 NOTE — Progress Notes (Signed)
PT unable to achieve Sp02 greater than 81% on 98% Face Tent and 6 lpm [place]. RT placed PT on NRB (at 15 lpm 02) and 6 lpm [place]- sp02 94%- NP aware.

## 2014-10-16 NOTE — Procedures (Signed)
Intubation Procedure Note Vidal SchwalbeRobert L Traywick [id number] 06/30/1966  Procedure: Intubation Indications: Respiratory insufficiency  Procedure Details Consent: Unable to obtain consent because of emergent medical necessity. Time Out: Verified patient identification, verified procedure, site/side was marked, verified correct patient position, special equipment/implants available, medications/allergies/relevent history reviewed, required imaging and test results available.  Performed  Maximum sterile technique was used including antiseptics, cap, gloves, gown, hand hygiene and mask.  MAC and 3    Evaluation Hemodynamic Status: BP stable throughout; O2 sats: stable throughout; initially required BVM ventilation as sats ~ 60% prior.  Patient's Current Condition: stable Complications: No apparent complications Patient did tolerate procedure well. Chest X-ray ordered to verify placement.  CXR: pending.   BABCOCK,PETE 10/16/2014  Oretha MilchALVA,Scarlette Hogston V. MD

## 2014-10-16 NOTE — Progress Notes (Signed)
Recruitment done by RT sats 79% now 91%. Vitals stable.

## 2014-10-16 NOTE — Progress Notes (Signed)
Post extubation PT has ability to whisper but not beyond at this time.

## 2014-10-16 NOTE — Plan of Care (Signed)
Problem: Phase I Progression Outcomes Goal: Hemodynamically stable Outcome: Completed/Met Date Met:  10/16/14 Goal: Patient tolerating weaning plan Outcome: Progressing

## 2014-10-16 NOTE — Progress Notes (Addendum)
Critical care F/U note  Events Extubated earlier this afternoon. Initially was in no distress. Only significant findings were: tachycardia and inability to phonate which we assumed to be due to vocal cord edema +/- trauma. Since extubation has required increased FIO2 support, and has been progressively agitated. His cough is very weak. Has needed NTS suctioning.   Temp:  [96.1 F (35.6 C)-99.5 F (37.5 C)] 96.1 F (35.6 C) (12/08 1100) Pulse Rate:  [92-122] 110 (12/08 1400) Resp:  [14-28] 16 (12/08 1400) BP: (117-158)/(80-124) 157/113 mmHg (12/08 1400) SpO2:  [82 %-100 %] 100 % (12/08 1400) FiO2 (%):  [40 %-60 %] 50 % (12/08 1125) Weight:  [43.1 kg (95 lb 0.3 oz)] 43.1 kg (95 lb 0.3 oz) (12/08 0500) Now on 100% NRB  General 48 year old male, s/p extubation this afternoon. Has had progressive agitation and work of breathing. Diaphoretic, clammy.  HENT: [place], no JVD. + temporal wasting MM are dry  Pulm scattered rhonchi. + accessory muscle use  Card: tachy rrr Abd: soft.  Neuro: awake, agitated.   Impression/plan Acute hypoxic respiratory failure s/p CAP  Prob emphysema  Poor mucous clearance   Plan Re-intubate Resume precedex Cont pulm hygiene Cont abx May need to consider trach   Simonne MartinetPeter E Babcock ACNP-BC [facility] Pager # [phone] OR # [phone] if no answer  Re-assessed pt , unable to clear secretions, higher FIO2,then required bagging & emergent ETT replacement. Vent & sedation orders given, CXR rechecked, updated family  The patient is critically ill with multiple organ systems failure and requires high complexity decision making for assessment and support, frequent evaluation and titration of therapies, application of advanced monitoring technologies and extensive interpretation of multiple databases. Critical Care Time devoted to patient care services described in this note independent of APP time is 35 minutes.

## 2014-10-16 NOTE — Evaluation (Signed)
SLP Cancellation Note  Patient Details Name: Brandon SchwalbeRobert L Montgomery MRN: [id number] DOB: 06/11/1966   Cancelled treatment:       Reason Eval/Treat Not Completed: Medical issues which prohibited therapy (pt now reintubated, please reorder if/when indicated)  Donavan Burnetamara Christorpher Hisaw, MS [facility] [phone]

## 2014-10-16 NOTE — Procedures (Signed)
Extubation Procedure Note  Patient Details:   Name: Brandon Montgomery DOB: 11/26/1965 MRN: [id number]   Airway Documentation:  Airway 7 mm (Active)  Secured at (cm) 24 cm 10/16/2014 11:25 AM  Measured From Lips 10/16/2014 11:25 AM  Secured Location Center 10/16/2014 11:25 AM  Secured By [employer] Tube Holder 10/16/2014 11:25 AM  Tube Holder Repositioned Yes 10/16/2014 11:25 AM  Cuff Pressure (cm H2O) 24 cm H2O 10/16/2014  7:40 AM  Site Condition Dry 10/16/2014 11:25 AM    Evaluation  O2 sats: stable throughout Complications: No apparent complications Patient did tolerate procedure well. Bilateral Breath Sounds: Rhonchi Suctioning: Oral, Airway Yes  Dairl PonderWalters, Andrews Tener Nannette 10/16/2014, 12:56 PM

## 2014-10-16 NOTE — Progress Notes (Signed)
Discussed with Dr. Delton CoombesByrum via [facility] CornElink about possibility of resuming tube feeding. No new orders at this time.

## 2014-10-16 NOTE — Progress Notes (Signed)
[facility] FOR AM LAB REPLACEMENT ONLY  The patient does [facility] for the [facility] Protocol based on the criteria listed below:   1. Is GFR >/= 40 ml/min? Yes.    Patient's GFR today is >90 2. Is urine output >/= 0.5 ml/kg/hr for the last 6 hours? Yes.   Patient's UOP is 0.60 ml/kg/hr 3. Is BUN < 60 mg/dL? Yes.    Patient's BUN today is 28 4. Abnormal electrolyte(s): K 3.6 5. Ordered repletion with: [facility] adult ICU electrolyte replacement protocol 6. If a panic level lab has been reported, has the CCM MD in charge been notified? Yes.  .   Physician:  Dr. Jorene Minorsamaswamy  Brandon Montgomery, Brandon Montgomery 10/16/2014 5:49 AM

## 2014-10-16 NOTE — Progress Notes (Signed)
RT placed PT on oxygen face tent in efforts to obtain Sp02 >=92%. RN aware.

## 2014-10-17 LAB — BASIC METABOLIC PANEL
ANION GAP: 15 (ref 5–15)
BUN: 30 mg/dL — AB (ref 6–23)
CHLORIDE: 99 meq/L (ref 96–112)
CO2: 32 meq/L (ref 19–32)
CREATININE: 0.37 mg/dL — AB (ref 0.50–1.35)
Calcium: 10.4 mg/dL (ref 8.4–10.5)
GFR calc Af Amer: >90 mL/min (ref >90)
GFR calc non Af Amer: >90 mL/min (ref >90)
GLUCOSE: 112 mg/dL — AB (ref 70–99)
Potassium: 4.1 mEq/L (ref 3.7–5.3)
Sodium: 146 mEq/L (ref 137–147)

## 2014-10-17 LAB — GLUCOSE, CAPILLARY
GLUCOSE-CAPILLARY: 112 mg/dL — AB (ref 70–99)
GLUCOSE-CAPILLARY: 127 mg/dL — AB (ref 70–99)
GLUCOSE-CAPILLARY: 130 mg/dL — AB (ref 70–99)
Glucose-Capillary: 97 mg/dL (ref 70–99)
Glucose-Capillary: 113 mg/dL — ABNORMAL HIGH (ref 70–99)
Glucose-Capillary: 128 mg/dL — ABNORMAL HIGH (ref 70–99)

## 2014-10-17 LAB — PHOSPHORUS: Phosphorus: 5.4 mg/dL — ABNORMAL HIGH (ref 2.3–4.6)

## 2014-10-17 LAB — CLOSTRIDIUM DIFFICILE BY PCR: CDIFFPCR: NEGATIVE

## 2014-10-17 MED ORDER — FREE WATER
200.0000 mL | Freq: Three times a day (TID) | Status: DC
  Administered 2014-10-17 – 2014-10-24 (×20): 200 mL

## 2014-10-17 MED ORDER — VITAL HIGH PROTEIN PO LIQD
20.0000 mL/h | ORAL | Status: DC
  Filled 2014-10-17: qty 1000

## 2014-10-17 MED ORDER — VITAL AF 1.2 CAL PO LIQD
1000.0000 mL | ORAL | Status: DC
  Administered 2014-10-17 – 2014-10-21 (×4): 1000 mL
  Filled 2014-10-17 (×8): qty 1000

## 2014-10-17 MED ORDER — DEXTROSE-NACL 5-0.45 % IV SOLN
INTRAVENOUS | Status: DC
  Administered 2014-10-17: 75 mL/h via INTRAVENOUS
  Administered 2014-10-18: 04:00:00 via INTRAVENOUS
  Administered 2014-10-19: 1000 mL via INTRAVENOUS
  Administered 2014-10-20: 75 mL/h via INTRAVENOUS
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
## 2014-10-17 NOTE — Consult Note (Signed)
WOC wound consult note Reason for Consult: Area os sDTI noted yesterday.  Patient is a 48 year-old with quadriplegia who was admitted on 10/08/14,  Has been intubated. Wound type: Pressure Pressure Ulcer POA: No Measurement: Total area affected measures 9cm x 8cm with erythema throughout.  In the center of this area is a darker, purple to deep red area measuring 5cm x 6cm that surrounds a 2.5cm x 4cm area of firm dark brown tissue. Wound bed: As described above.   Drainage (amount, consistency, odor)  Periwound:As described above. Dressing procedure/placement/frequency: The presentation of this area is consistent with a deep tissue injury.  I anticipate that this will evolve and that the top layers of intact tissue are likely to slough off and reveal and ulcer of full  thickness depth. Despite being critically ill, the patient has been turned and repositioned per our protocols and is lying upon a therapeutic mattress with low air loss feature.  I have added a soft silicone foam dressing made for the area of concern (Allevyn Life Sacral) and provided guidance for the staff for continued care of the integumentary system.  These interventions include providing Prevalon pressure redistribution boots for the (intact, but vulnerable) bilateral heels and all other (intact) bony prominences, such as hips, ischial tuberosities, scapulae, etc. WOC nursing team will not follow routinely, but will remain available to this patient, the nursing and medical teams.  Please re-consult if needed in-between visits. Thanks, Ladona MowLaurie Rayden Dock, MSN, RN, GNP, BrigantineWOCN, CWON-AP [phone])

## 2014-10-17 NOTE — Progress Notes (Signed)
PT Cancellation Note  Patient Details Name: Vidal SchwalbeRobert L Rease MRN: [id number] DOB: 01/08/1966   Cancelled Treatment:    Reason Eval/Treat Not Completed: Medical issues which prohibited therapy (REINTUBATED AFTER EXTUBATED.)   Rada HayHill, Mary-Ann Pennella Elizabeth 10/17/2014, 8:30 AM Blanchard KelchKaren Bonni Neuser PT [phone]

## 2014-10-17 NOTE — Progress Notes (Signed)
   Temp 38.5C. RN asking for temp cntrol   PLan Based on Nov 2015. Dec 20-15 NEJM paper - no tylenol.    Dr. Kalman ShanMurali Dyshaun Bonzo, M.D., [facility]essaF.C.C.P Pulmonary and Critical Care Medicine Staff Physician [facility] Pulmonary and Critical Care Pager: [phone], If no answer or between  15:00h - 7:00h: call [phone]  10/17/2014 12:11 AM

## 2014-10-17 NOTE — Progress Notes (Signed)
Clinical Social Work  Consult received for SNF placement but per chart review, patient on vent. CSW will follow up once patient is more stable to assist with DC planning.  Brandon LightningHolly Malaysha Arlen, LCSW (Coverage for MicrosoftSuzanna Kidd)

## 2014-10-18 ENCOUNTER — Inpatient Hospital Stay (HOSPITAL_COMMUNITY): Payer: Medicare Other

## 2014-10-18 LAB — BASIC METABOLIC PANEL
Anion gap: 11 (ref 5–15)
BUN: 32 mg/dL — AB (ref 6–23)
CO2: 31 mEq/L (ref 19–32)
CREATININE: 0.39 mg/dL — AB (ref 0.50–1.35)
Calcium: 9.7 mg/dL (ref 8.4–10.5)
Chloride: 103 mEq/L (ref 96–112)
GLUCOSE: 151 mg/dL — AB (ref 70–99)
Potassium: 3.1 mEq/L — ABNORMAL LOW (ref 3.7–5.3)
Sodium: 145 mEq/L (ref 137–147)

## 2014-10-18 LAB — APTT: aPTT: 39 seconds — ABNORMAL HIGH (ref 24–37)

## 2014-10-18 LAB — PROTIME-INR
INR: 1.04 (ref 0.00–1.49)
Prothrombin Time: 13.7 seconds (ref 11.6–15.2)

## 2014-10-18 LAB — CBC
HCT: 47.3 % (ref 39.0–52.0)
HEMOGLOBIN: 15.1 g/dL (ref 13.0–17.0)
MCH: 31.9 pg (ref 26.0–34.0)
MCHC: 31.9 g/dL (ref 30.0–36.0)
MCV: 99.8 fL (ref 78.0–100.0)
Platelets: 275 10*3/uL (ref 150–400)
RBC: 4.74 MIL/uL (ref 4.22–5.81)
RDW: 13.7 % (ref 11.5–15.5)
WBC: 15 10*3/uL — ABNORMAL HIGH (ref 4.0–10.5)

## 2014-10-18 LAB — GLUCOSE, CAPILLARY
Glucose-Capillary: 146 mg/dL — ABNORMAL HIGH (ref 70–99)
Glucose-Capillary: 128 mg/dL — ABNORMAL HIGH (ref 70–99)
Glucose-Capillary: 119 mg/dL — ABNORMAL HIGH (ref 70–99)
Glucose-Capillary: 114 mg/dL — ABNORMAL HIGH (ref 70–99)

## 2014-10-18 MED ORDER — PROPOFOL 10 MG/ML IV EMUL: 5.0000 ug/kg/min | Freq: Once | INTRAVENOUS | Status: AC

## 2014-10-18 MED ORDER — ETOMIDATE 2 MG/ML IV SOLN: 40.0000 mg | Freq: Once | INTRAVENOUS | Status: DC

## 2014-10-18 MED ORDER — VECURONIUM BROMIDE 10 MG IV SOLR
10.0000 mg | Freq: Once | INTRAVENOUS | Status: AC
  Administered 2014-10-19: 7 mg via INTRAVENOUS
  Filled 2014-10-18: qty 10

## 2014-10-18 MED ORDER — POTASSIUM CHLORIDE 20 MEQ/15ML (10%) PO SOLN
30.0000 meq | ORAL | Status: AC
  Administered 2014-10-18 (×2): 30 meq
  Filled 2014-10-18 (×2): qty 30

## 2014-10-18 MED ORDER — FENTANYL CITRATE 0.05 MG/ML IJ SOLN: 200.0000 ug | Freq: Once | INTRAMUSCULAR | Status: DC

## 2014-10-18 MED ORDER — MIDAZOLAM HCL 2 MG/2ML IJ SOLN
4.0000 mg | Freq: Once | INTRAMUSCULAR | Status: AC
  Administered 2014-10-19: 1 mg via INTRAVENOUS
  Filled 2014-10-18: qty 4
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
## 2014-10-18 NOTE — Progress Notes (Signed)
Physical Therapy Discharge Patient Details Name: Vidal SchwalbeRobert L Kunzler MRN: [id number] DOB: 12/24/1965 Today's Date: 10/18/2014 Time:  -     Patient discharged from PT services secondary to medical decline - will need to re-order PT to resume therapy services.noted that patient is  Quadriplegic, unknown  Prior level of function. Continues to be on vent.     Rada HayHill, Kamden Reber Elizabeth 10/18/2014, 5:45 PM

## 2014-10-18 NOTE — Progress Notes (Signed)
[facility] FOR AM LAB REPLACEMENT ONLY  The patient does [facility] based on the criteria listed below:   1. Is GFR >/= 40 ml/min? Yes.    Patient's GFR today is >90 2. Is urine output >/= 0.5 ml/kg/hr for the last 6 hours? Yes.   Patient's UOP is .99 ml/kg/hr 3. Is BUN < 60 mg/dL? Yes.    Patient's BUN today is 32 4. Abnormal electrolyte  K 3.1 5. Ordered repletion with: per protocol 6. If a panic level lab has been reported, has the CCM MD in charge been notified? Yes.  .   Physician:  Sharol RousselRamaswamy  Luther Newhouse McEachran 10/18/2014 5:09 AM

## 2014-10-18 NOTE — Progress Notes (Signed)
CARE MANAGEMENT NOTE 10/18/2014  Patient:  Brandon Montgomery,Brandon Montgomery   Account Number:  [id number]  Date Initiated:  10/09/2014  Documentation initiated by:  DAVIS,RHONDA  Subjective/Objective Assessment:   CAP and VDRF/48 y.o. M who is a quadriplegic [facility] with cough, congestion, SOB.  In ED, remained hypoxic despite 6L O2, CXR revealed LLL PNA.  Started on BiPAP but ultimately failed and required intubation.     Action/Plan:   TBD poss home once resp status stable/lives with parent.   Anticipated DC Date:  10/21/2014   Anticipated DC Plan:  SKILLED NURSING FACILITY  In-house referral  NA      DC Planning Services  CM consult      PAC Choice  NA   Choice offered to / List presented to:  NA   DME arranged  NA      DME agency  NA     HH arranged  NA      HH agency  NA   Status of service:  In process, will continue to follow Medicare Important Message given?   (If response is "NO", the following Medicare IM given date fields will be blank) Date Medicare IM given:   Medicare IM given by:   Date Additional Medicare IM given:   Additional Medicare IM given by:    Discharge Disposition:    Per UR Regulation:  Reviewed for med. necessity/level of care/duration of stay  If discussed at Long Length of Stay Meetings, dates discussed:   10/16/2014  10/18/2014    Comments:  [id number]/Rhonda Davis,RN,BSN,CCM: Remains on vent with conversion to trach planned within the 24-48 hours. [id number] and 10/09/2014/[name]. Earlene Plateravis, RN, BSN, CCM: Chart review for medical necessity and patient discharge needs. Case Manager will follow for patient condition changes. 09/21/2014/[name]. Earlene Plateravis, RN, BSN, CCM: CHART NOTE FOR PROGRESSION: failed bipap required intubation/no previous record on file since 2005.

## 2014-10-18 NOTE — Progress Notes (Signed)
PT Cancellation Note  Patient Details Name: Brandon Montgomery MRN: [id number] DOB: 08/11/1966   Cancelled Treatment:    Reason Eval/Treat Not Completed: Medical issues which prohibited therapy (pt. on vent)   Rada HayHill, Cornelious Diven Elizabeth 10/18/2014, 8:03 AM Blanchard KelchKaren Janaki Exley PT [phone]

## 2014-10-19 ENCOUNTER — Encounter (HOSPITAL_COMMUNITY): Payer: Self-pay | Admitting: Internal Medicine

## 2014-10-19 ENCOUNTER — Inpatient Hospital Stay (HOSPITAL_COMMUNITY): Payer: Medicare Other

## 2014-10-19 DIAGNOSIS — J432 Centrilobular emphysema: Secondary | ICD-10-CM

## 2014-10-19 DIAGNOSIS — J9600 Acute respiratory failure, unspecified whether with hypoxia or hypercapnia: Secondary | ICD-10-CM | POA: Insufficient documentation

## 2014-10-19 HISTORY — PX: TRACHEOSTOMY: SUR1362

## 2014-10-19 LAB — BASIC METABOLIC PANEL
Anion gap: 10 (ref 5–15)
BUN: 19 mg/dL (ref 6–23)
CHLORIDE: 100 meq/L (ref 96–112)
CO2: 29 mEq/L (ref 19–32)
CREATININE: 0.3 mg/dL — AB (ref 0.50–1.35)
Calcium: 9.1 mg/dL (ref 8.4–10.5)
GFR calc non Af Amer: >90 mL/min (ref >90)
GLUCOSE: 105 mg/dL — AB (ref 70–99)
Potassium: 3.6 mEq/L — ABNORMAL LOW (ref 3.7–5.3)
Sodium: 139 mEq/L (ref 137–147)

## 2014-10-19 LAB — CBC
HEMATOCRIT: 38.8 % — AB (ref 39.0–52.0)
HEMOGLOBIN: 12.3 g/dL — AB (ref 13.0–17.0)
MCH: 30.6 pg (ref 26.0–34.0)
MCHC: 31.7 g/dL (ref 30.0–36.0)
MCV: 96.5 fL (ref 78.0–100.0)
Platelets: 269 10*3/uL (ref 150–400)
RBC: 4.02 MIL/uL — AB (ref 4.22–5.81)
RDW: 13.7 % (ref 11.5–15.5)
WBC: 14.4 10*3/uL — ABNORMAL HIGH (ref 4.0–10.5)

## 2014-10-19 LAB — GLUCOSE, CAPILLARY
GLUCOSE-CAPILLARY: 111 mg/dL — AB (ref 70–99)
GLUCOSE-CAPILLARY: 104 mg/dL — AB (ref 70–99)
Glucose-Capillary: 115 mg/dL — ABNORMAL HIGH (ref 70–99)

## 2014-10-19 LAB — PHOSPHORUS: Phosphorus: 3.1 mg/dL (ref 2.3–4.6)

## 2014-10-19 MED ORDER — POTASSIUM CHLORIDE 20 MEQ/15ML (10%) PO SOLN
20.0000 meq | ORAL | Status: AC
  Administered 2014-10-19 (×2): 20 meq
  Filled 2014-10-19 (×2): qty 15

## 2014-10-19 NOTE — Procedures (Signed)
Bedside Tracheostomy Insertion Procedure Note   Patient Details:   Name: Brandon Montgomery DOB: 12/19/1965 MRN: [id number]  Procedure: Tracheostomy  Pre Procedure Assessment: ET Tube Size: 7.0 ET Tube secured at lip (cm): 23 Bite block in place: No Breath Sounds: Rhonch  Post Procedure Assessment: BP 108/63 mmHg  Pulse 95  Temp(Src) 100.6 F (38.1 C) (Core (Comment))  Resp 14  Ht 5\' 8"  (1.727 m)  Wt 99 lb 6.8 oz (45.1 kg)  BMI 15.12 kg/m2  SpO2 89% O2 sats: stable throughout Complications: No apparent complications Patient did tolerate procedure well Tracheostomy Brand:Shiley Tracheostomy Style:Cuffed Tracheostomy Size: 6 Tracheostomy Secured EXB:[id number]:Sutures, velcro Tracheostomy Placement Confirmation:Trach cuff visualized and in place and Chest X ray ordered for placement    Jacqulynn CadetHopper, Nevaeh Korte David 10/19/2014, 7:42 AM

## 2014-10-19 NOTE — Procedures (Signed)
Name:  Brandon SchwalbeRobert L Domagala MRN:  [id number] DOB:  09/06/1966  OPERATIVE NOTE  Procedure:  Percutaneous tracheostomy. - redo  Indications:  Ventilator-dependent respiratory failure.  Consent:  Procedure, alternatives, risks and benefits discussed with medical POA.  Questions answered.  Consent obtained.  Anesthesia:  Prop, fent, versed, vec  Procedure summary:  Appropriate equipment was assembled.  The patient was identified as Brandon Schwalbeobert L Fiero and safety timeout was performed. The patient was placed in supine position with a towel roll behind shoulder blades and neck extended.  Sterile technique was used. The patient's neck and upper chest were prepped using chlorhexidine / alcohol scrub and the field was draped in usual sterile fashion with full body drape. After the adequate sedation / anesthesia was achieved, attention was directed at the midline trachea, where the cricothyroid membrane was palpated. Approximately two fingerbreadths above the sternal notch,  1 cm below old trach scar ( which seems high and too close to crich)a verticle incision was created with a scalpel after local infiltration with 0.2% Lidocaine. Then, using Seldinger technique and a percutaneous tracheostomy set, the trachea was entered with a 14 gauge needle with an overlying sheath. This was all confirmed under direct visualization of a fiberoptic flexible bronchoscope. Entrance into the trachea was identified through the third tracheal ring interspace. Following this, a guidewire was inserted. The needle was removed, leaving the sheath and the guidewire intact. Next, the sheath was removed and a small dilator was inserted. The tracheal rings were then dilated. A #6 Shiley was then opened. The balloon was checked. It was placed over a tracheal dilator, which was then advanced over the guidewire and through the previously dilated tract. The Shiley tracheostomy tube was noted to pass in the trachea with little resistance. The guidewire  and dilator tubes were removed from the trachea. An inner cannula was placed through the tracheostomy tube. The tracheostomy was then secured at the anterior neck with 4 monofilament sutures. The oral endotracheal tube was removed and the ventilator was attached to the newly placed tracheostomy tube. Adequate tidal volumes were noted. The cuff was inflated and no evidence of air leak was noted. No evidence of bleeding was noted. At this point, the procedure was concluded. Post-procedure chest x-ray was ordered.  Complications:  No immediate complications were noted.  Hemodynamic parameters and oxygenation remained stable throughout the procedure.  Estimated blood loss:  Less then1 mL.  Nelda BucksFEINSTEIN,Shaylee Stanislawski J., MD Pulmonary and Critical Care Medicine [facility] Pager: [phone]  10/19/2014, 7:17 AM

## 2014-10-19 NOTE — Progress Notes (Signed)
CSW continuing to follow.   CSW spoke with NP regarding pt. Pt had trach placed today. Disposition of LTAC vs SNF will be dependent on weaning over weekend.   CSW to continue to follow pt progress and complete full psychosocial assessment as appropriate.  Loletta SpecterSuzanna Kidd, MSW, LCSW Clinical Social Work [phone]

## 2014-10-19 NOTE — Procedures (Signed)
Bronchoscopy  for Percutaneous  Tracheostomy  Name: Brandon SchwalbeRobert L Montgomery MRN: [id number] DOB: 06/25/1966 Procedure: Bronchoscopy for Percutaneous Tracheostomy Indications: Diagnostic evaluation of the airways, Obtain specimens for culture and/or other diagnostic studies and Remove secretions In conjunction with: Dr. Tyson AliasFeinstein   Procedure Details Consent: Risks of procedure as well as the alternatives and risks of each were explained to the (patient/caregiver).  Consent for procedure obtained. Time Out: Verified patient identification, verified procedure, site/side was marked, verified correct patient position, special equipment/implants available, medications/allergies/relevent history reviewed, required imaging and test results available.  Performed  In preparation for procedure, patient was given 100% FiO2 and bronchoscope lubricated. Sedation: Benzodiazepines, Muscle relaxants and Etomidate  Airway entered and the following bronchi were examined: RML.   Procedures performed: Endotracheal Tube retracted in 2 cm increments. Cannulation of airway observed. Dilation observed. Placement of trachel tube  observed . No overt complications. Bronchoscope removed.    Evaluation Hemodynamic Status: Transient hypotension treated with fluid; O2 sats: stable throughout Patient's Current Condition: stable Specimens:  Sent purulent fluid from RML Complications: No apparent complications Patient did tolerate procedure well. Stenosis noted 2 cm above site of new tracheostomy. ETT was barely able to fit thru stenotic area.  Brandon CanalesSteve Montgomery ACNP Brandon Montgomery PCCM Pager [phone] till 3 pm If no answer page [phone]  Supervised by me   Brandon Montgomery,Brandon Telford V. MD 10/19/2014, 7:20 AM
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
## 2014-10-19 NOTE — Progress Notes (Signed)
[facility] FOR AM LAB REPLACEMENT ONLY  The patient does [facility] Protocol based on the criteria listed below:   1. Is GFR >/= 40 ml/min? Yes.    Patient's GFR today is >90 2. Is urine output >/= 0.5 ml/kg/hr for the last 6 hours? Yes.   Patient's UOP is .68 ml/kg/hr 3. Is BUN < 60 mg/dL? Yes.    Patient's BUN today is 19 4. Abnormal electrolyte K 3.6 5. Ordered repletion with: per protocol 6. If a panic level lab has been reported, has the CCM MD in charge been notified? Yes.  .   Physician:  Faye Ramsayfeinstein  Mariam Helbert McEachran 10/19/2014 4:29 AM

## 2014-10-20 ENCOUNTER — Inpatient Hospital Stay (HOSPITAL_COMMUNITY): Payer: Medicare Other

## 2014-10-20 DIAGNOSIS — J9621 Acute and chronic respiratory failure with hypoxia: Secondary | ICD-10-CM

## 2014-10-20 DIAGNOSIS — Z930 Tracheostomy status: Secondary | ICD-10-CM

## 2014-10-20 DIAGNOSIS — J9620 Acute and chronic respiratory failure, unspecified whether with hypoxia or hypercapnia: Secondary | ICD-10-CM

## 2014-10-20 LAB — CBC
HCT: 36.4 % — ABNORMAL LOW (ref 39.0–52.0)
HEMOGLOBIN: 11.7 g/dL — AB (ref 13.0–17.0)
MCH: 31 pg (ref 26.0–34.0)
MCHC: 32.1 g/dL (ref 30.0–36.0)
MCV: 96.3 fL (ref 78.0–100.0)
PLATELETS: 232 10*3/uL (ref 150–400)
RBC: 3.78 MIL/uL — AB (ref 4.22–5.81)
RDW: 13.5 % (ref 11.5–15.5)
WBC: 13.8 10*3/uL — AB (ref 4.0–10.5)

## 2014-10-20 LAB — GLUCOSE, CAPILLARY
GLUCOSE-CAPILLARY: 100 mg/dL — AB (ref 70–99)
GLUCOSE-CAPILLARY: 118 mg/dL — AB (ref 70–99)
GLUCOSE-CAPILLARY: 96 mg/dL (ref 70–99)
GLUCOSE-CAPILLARY: 131 mg/dL — AB (ref 70–99)
Glucose-Capillary: 102 mg/dL — ABNORMAL HIGH (ref 70–99)
Glucose-Capillary: 112 mg/dL — ABNORMAL HIGH (ref 70–99)
Glucose-Capillary: 108 mg/dL — ABNORMAL HIGH (ref 70–99)

## 2014-10-20 LAB — BASIC METABOLIC PANEL
ANION GAP: 9 (ref 5–15)
BUN: 13 mg/dL (ref 6–23)
CHLORIDE: 96 meq/L (ref 96–112)
CO2: 29 mEq/L (ref 19–32)
Calcium: 8.6 mg/dL (ref 8.4–10.5)
Creatinine, Ser: 0.27 mg/dL — ABNORMAL LOW (ref 0.50–1.35)
Glucose, Bld: 106 mg/dL — ABNORMAL HIGH (ref 70–99)
POTASSIUM: 3.7 meq/L (ref 3.7–5.3)
Sodium: 134 mEq/L — ABNORMAL LOW (ref 137–147)

## 2014-10-20 MED ORDER — FENTANYL CITRATE 0.05 MG/ML IJ SOLN
25.0000 ug | INTRAMUSCULAR | Status: DC | PRN
Start: 2014-10-20 — End: 2014-10-24
  Administered 2014-10-20 – 2014-10-21 (×7): 100 ug via INTRAVENOUS
  Administered 2014-10-22: 50 ug via INTRAVENOUS
  Administered 2014-10-22: 100 ug via INTRAVENOUS
  Administered 2014-10-23 – 2014-10-24 (×2): 50 ug via INTRAVENOUS
  Filled 2014-10-20 (×13): qty 2

## 2014-10-20 MED ORDER — ACETAMINOPHEN 160 MG/5ML PO SOLN
650.0000 mg | Freq: Four times a day (QID) | ORAL | Status: DC | PRN
  Administered 2014-10-20 (×2): 650 mg
  Filled 2014-10-20 (×3): qty 20.3
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
## 2014-10-20 NOTE — Progress Notes (Signed)
RT placed PT back on full support due to low Sp02 on ATC and increasing work of breathing. RN aware. PT weaned a total of 10 hours today.

## 2014-10-20 NOTE — Progress Notes (Signed)
eLink Physician-Brief Progress Note Patient Name: Brandon SchwalbeRobert L Montgomery DOB: 05/09/1966 MRN: [id number]   Date of Service  10/20/2014  HPI/Events of Note  sats lower, now on .70 fio2  eICU Interventions  chk cxr, raise peep to 10     Intervention Category Major Interventions: Respiratory failure - evaluation and management  Shan Levansatrick Taran Hable 10/20/2014, 6:27 PM

## 2014-10-21 ENCOUNTER — Inpatient Hospital Stay (HOSPITAL_COMMUNITY): Payer: Medicare Other

## 2014-10-21 DIAGNOSIS — R609 Edema, unspecified: Secondary | ICD-10-CM

## 2014-10-21 LAB — BLOOD GAS, ARTERIAL
ACID-BASE EXCESS: 2.5 mmol/L — AB (ref 0.0–2.0)
Bicarbonate: 26.8 mEq/L — ABNORMAL HIGH (ref 20.0–24.0)
Drawn by: 257701
FIO2: 0.9 %
MECHVT: 480 mL
O2 SAT: 97.5 %
PATIENT TEMPERATURE: 106
PEEP/CPAP: 10 cmH2O
RATE: 14 resp/min
TCO2: 23.5 mmol/L (ref 0–100)
pCO2 arterial: 51.5 mmHg — ABNORMAL HIGH (ref 35.0–45.0)
pH, Arterial: 7.359 (ref 7.350–7.450)
pO2, Arterial: 129 mmHg — ABNORMAL HIGH (ref 80.0–100.0)

## 2014-10-21 LAB — CBC WITH DIFFERENTIAL/PLATELET
BASOS ABS: 0 10*3/uL (ref 0.0–0.1)
Basophils Relative: 0 % (ref 0–1)
Eosinophils Absolute: 0 10*3/uL (ref 0.0–0.7)
Eosinophils Relative: 0 % (ref 0–5)
HEMATOCRIT: 41 % (ref 39.0–52.0)
HEMOGLOBIN: 13.2 g/dL (ref 13.0–17.0)
LYMPHS ABS: 0.6 10*3/uL — AB (ref 0.7–4.0)
Lymphocytes Relative: 3 % — ABNORMAL LOW (ref 12–46)
MCH: 30.8 pg (ref 26.0–34.0)
MCHC: 32.2 g/dL (ref 30.0–36.0)
MCV: 95.6 fL (ref 78.0–100.0)
MONO ABS: 1.3 10*3/uL — AB (ref 0.1–1.0)
Monocytes Relative: 6 % (ref 3–12)
NEUTROS ABS: 18.8 10*3/uL — AB (ref 1.7–7.7)
Neutrophils Relative %: 91 % — ABNORMAL HIGH (ref 43–77)
Platelets: 297 10*3/uL (ref 150–400)
RBC: 4.29 MIL/uL (ref 4.22–5.81)
RDW: 13.4 % (ref 11.5–15.5)
WBC: 20.6 10*3/uL — AB (ref 4.0–10.5)

## 2014-10-21 LAB — GLUCOSE, CAPILLARY
GLUCOSE-CAPILLARY: 114 mg/dL — AB (ref 70–99)
GLUCOSE-CAPILLARY: 147 mg/dL — AB (ref 70–99)
GLUCOSE-CAPILLARY: 148 mg/dL — AB (ref 70–99)
GLUCOSE-CAPILLARY: 156 mg/dL — AB (ref 70–99)
GLUCOSE-CAPILLARY: 196 mg/dL — AB (ref 70–99)
Glucose-Capillary: 103 mg/dL — ABNORMAL HIGH (ref 70–99)
Glucose-Capillary: 128 mg/dL — ABNORMAL HIGH (ref 70–99)
Glucose-Capillary: 133 mg/dL — ABNORMAL HIGH (ref 70–99)

## 2014-10-21 LAB — LACTIC ACID, PLASMA: Lactic Acid, Venous: 1.9 mmol/L (ref 0.5–2.2)

## 2014-10-21 LAB — BASIC METABOLIC PANEL
ANION GAP: 12 (ref 5–15)
BUN: 15 mg/dL (ref 6–23)
CHLORIDE: 97 meq/L (ref 96–112)
CO2: 30 mEq/L (ref 19–32)
Calcium: 9.1 mg/dL (ref 8.4–10.5)
Creatinine, Ser: 0.31 mg/dL — ABNORMAL LOW (ref 0.50–1.35)
GFR calc Af Amer: >90 mL/min (ref >90)
GFR calc non Af Amer: >90 mL/min (ref >90)
GLUCOSE: 125 mg/dL — AB (ref 70–99)
Potassium: 3 mEq/L — ABNORMAL LOW (ref 3.7–5.3)
Sodium: 139 mEq/L (ref 137–147)

## 2014-10-21 LAB — PROCALCITONIN: PROCALCITONIN: 2.42 ng/mL

## 2014-10-21 LAB — PHOSPHORUS: PHOSPHORUS: 3.1 mg/dL (ref 2.3–4.6)

## 2014-10-21 LAB — MAGNESIUM: Magnesium: 1.8 mg/dL (ref 1.5–2.5)

## 2014-10-21 LAB — CLOSTRIDIUM DIFFICILE BY PCR: CDIFFPCR: NEGATIVE

## 2014-10-21 MED ORDER — PIPERACILLIN-TAZOBACTAM 3.375 G IVPB
3.3750 g | Freq: Three times a day (TID) | INTRAVENOUS | Status: DC
  Administered 2014-10-21 – 2014-10-24 (×9): 3.375 g via INTRAVENOUS
  Filled 2014-10-21 (×9): qty 50

## 2014-10-21 MED ORDER — POTASSIUM CHLORIDE 20 MEQ/15ML (10%) PO SOLN
40.0000 meq | Freq: Once | ORAL | Status: AC
  Administered 2014-10-21: 40 meq
  Filled 2014-10-21: qty 30

## 2014-10-21 MED ORDER — HYDRALAZINE HCL 20 MG/ML IJ SOLN
10.0000 mg | Freq: Four times a day (QID) | INTRAMUSCULAR | Status: DC | PRN
  Administered 2014-10-21: 20 mg via INTRAVENOUS
  Filled 2014-10-21 (×2): qty 1

## 2014-10-21 MED ORDER — ACETAMINOPHEN 325 MG PO TABS: 650.0000 mg | ORAL_TABLET | ORAL | Status: DC

## 2014-10-21 MED ORDER — CLONAZEPAM 1 MG PO TABS
1.0000 mg | ORAL_TABLET | Freq: Two times a day (BID) | ORAL | Status: DC
  Administered 2014-10-21 (×2): 1 mg
  Filled 2014-10-21 (×2): qty 1

## 2014-10-21 MED ORDER — METOPROLOL TARTRATE 1 MG/ML IV SOLN
2.5000 mg | INTRAVENOUS | Status: DC | PRN
  Administered 2014-10-21: 5 mg via INTRAVENOUS
  Administered 2014-10-21 – 2014-10-22 (×3): 2.5 mg via INTRAVENOUS
  Filled 2014-10-21 (×3): qty 5

## 2014-10-21 MED ORDER — FAMOTIDINE 40 MG/5ML PO SUSR: 20.0000 mg | Freq: Every day | ORAL | Status: DC

## 2014-10-21 MED ORDER — LABETALOL HCL 5 MG/ML IV SOLN: 20.0000 mg | INTRAVENOUS | Status: DC

## 2014-10-21 MED ORDER — SODIUM CHLORIDE 0.9 % IV SOLN: INTRAVENOUS | Status: AC

## 2014-10-21 MED ORDER — ACETAMINOPHEN 160 MG/5ML PO SOLN
650.0000 mg | Freq: Four times a day (QID) | ORAL | Status: DC | PRN
  Administered 2014-10-21 – 2014-10-22 (×3): 650 mg via ORAL
  Filled 2014-10-21 (×4): qty 20.3

## 2014-10-21 MED ORDER — IPRATROPIUM BROMIDE 0.02 % IN SOLN
0.5000 mg | Freq: Four times a day (QID) | RESPIRATORY_TRACT | Status: DC
  Administered 2014-10-21 – 2014-10-24 (×12): 0.5 mg via RESPIRATORY_TRACT
  Filled 2014-10-21 (×12): qty 2.5

## 2014-10-21 MED ORDER — IBUPROFEN 100 MG/5ML PO SUSP
400.0000 mg | Freq: Once | ORAL | Status: AC
  Administered 2014-10-21: 400 mg via ORAL
  Filled 2014-10-21: qty 20

## 2014-10-21 MED ORDER — RANITIDINE HCL 150 MG/10ML PO SYRP
150.0000 mg | ORAL_SOLUTION | Freq: Every day | ORAL | Status: DC
  Administered 2014-10-21 – 2014-10-23 (×3): 150 mg via ORAL
  Filled 2014-10-21 (×4): qty 10

## 2014-10-21 MED ORDER — LEVALBUTEROL HCL 0.63 MG/3ML IN NEBU
0.6300 mg | INHALATION_SOLUTION | Freq: Four times a day (QID) | RESPIRATORY_TRACT | Status: DC
  Administered 2014-10-21 – 2014-10-24 (×12): 0.63 mg via RESPIRATORY_TRACT
  Filled 2014-10-21 (×12): qty 3

## 2014-10-21 MED ORDER — POTASSIUM CHLORIDE 20 MEQ/15ML (10%) PO SOLN
30.0000 meq | ORAL | Status: AC
  Administered 2014-10-21 (×2): 30 meq
  Filled 2014-10-21 (×2): qty 30

## 2014-10-21 MED ORDER — METOPROLOL TARTRATE 1 MG/ML IV SOLN
5.0000 mg | Freq: Once | INTRAVENOUS | Status: DC
  Filled 2014-10-21: qty 5

## 2014-10-21 MED ORDER — FUROSEMIDE 10 MG/ML IJ SOLN
40.0000 mg | Freq: Once | INTRAMUSCULAR | Status: AC
  Administered 2014-10-21: 40 mg via INTRAVENOUS
  Filled 2014-10-21: qty 4

## 2014-10-21 MED ORDER — METOPROLOL TARTRATE 1 MG/ML IV SOLN
INTRAVENOUS | Status: AC
  Filled 2014-10-21: qty 5

## 2014-10-21 MED ORDER — POTASSIUM CHLORIDE 20 MEQ/15ML (10%) PO SOLN
40.0000 meq | Freq: Two times a day (BID) | ORAL | Status: AC
  Administered 2014-10-21 – 2014-10-22 (×2): 40 meq via ORAL
  Filled 2014-10-21 (×2): qty 30

## 2014-10-21 MED ORDER — VANCOMYCIN HCL 500 MG IV SOLR
500.0000 mg | Freq: Two times a day (BID) | INTRAVENOUS | Status: DC
  Administered 2014-10-21 – 2014-10-23 (×5): 500 mg via INTRAVENOUS
  Filled 2014-10-21 (×5): qty 500

## 2014-10-21 MED ORDER — MAGNESIUM SULFATE 2 GM/50ML IV SOLN
2.0000 g | Freq: Once | INTRAVENOUS | Status: AC
  Administered 2014-10-21: 2 g via INTRAVENOUS
  Filled 2014-10-21: qty 50

## 2014-10-21 NOTE — Progress Notes (Signed)
Orders written for 8 ml/kg tidal volume, When tidal volume increased to 540 ml, the Pt's PIP climbed into the low 40's cm H2O.  Pt placed on 7 ml/kg (480 ml) tidal volume at this time.  Pt is comfortable and maintaining SpO2.  Will continue to try increasing the Pt's tidal volume to 8 ml/kg with each Vent check and communicate to the incoming RT to try to increase the Pt's tidal volume to 8 ml/kg again and see if tolerated at that time if I am unsuccessful.

## 2014-10-21 NOTE — Progress Notes (Signed)
eLink Physician-Brief Progress Note Patient Name: Brandon SchwalbeRobert L Montgomery DOB: 12/01/1965 MRN: [id number]   Date of Service  10/21/2014  HPI/Events of Note  HR to 160-170, appears to  be regular, ECG pending. BP has dropped with the tachycardia. Cancel labetalol and give metoprolol now for rate control. Repeat ECG when he slows down  eICU Interventions       Intervention Category Major Interventions: Arrhythmia - evaluation and management  BYRUM,Delquan S. 10/21/2014, 6:39 AM

## 2014-10-21 NOTE — Progress Notes (Signed)
VASCULAR LAB PRELIMINARY  PRELIMINARY  PRELIMINARY  PRELIMINARY  Bilateral lower extremity venous duplex  completed.    Preliminary report:  Bilateral:  No obvious evidence of DVT.  Technically limited by patient position and body habitus.  Cuca Benassi, RVT 10/21/2014, 3:19 PM

## 2014-10-21 NOTE — Progress Notes (Signed)
14:46 Paged Dr. Marchelle Gearingamaswamy to report:  HR 152, temp 105.1, BP 95/71.  Prn Lopressor ordered.  16:15 Called [facility], spoke to CowdenMarilyn, Charity fundraiserN.  Asked that Dr Katrinka BlazingSmith look at the patient's vital signs.  Temp 105.8, BP 87/63, HR 131, Sat 100% on ventilator/FIO2 95%, RR 33.    17:50 Called [facility] again.  Spoke to Dr. Illene LabradorJames P. Smith.  Reported : Patient breathing consistently with mouth open, change in behavior, restlessness, vital signs-temp 106.2, BP 151/98, HR 132, RR 21.  O2  sats more difficult to track but 99-100% when tracking.  O2 probe changed multiple times and applied to different areas.  Blood gas ordered and performed.  Tylenol ordered and administered via NG tube.  IV fluids increased. Dr. Katrinka BlazingSmith viewed the patient.

## 2014-10-21 NOTE — Progress Notes (Signed)
Pt temp has remained as high as 107F.  Ice applied, tylenol given earlier, IVF continued, fans being used in room. Orders received from Dr. Katrinka BlazingSmith in [facility] for a one time dose of ibuprophen and lopressor given cautiously for heart rate. Temp down from 41.7C to 41.2, heart rate 127 (from 156) and BP 107/77.  Pt alert but at times seems confused.  Will continue to monitor.

## 2014-10-21 NOTE — Progress Notes (Signed)
eLink Physician-Brief Progress Note Patient Name: Brandon SchwalbeRobert L Allensworth DOB: 10/15/1966 MRN: [id number]   Date of Service  10/21/2014  HPI/Events of Note  Hypertensive tonight, has received fentanyl and ativan, remains elevated. Hydralazine ordered prn. Goal < 170 and 90  eICU Interventions       Intervention Category Intermediate Interventions: Hypertension - evaluation and management  Donnivan Villena,Kemper S. 10/21/2014, 12:27 AM

## 2014-10-21 NOTE — Progress Notes (Signed)
[facility] PROTOC[facility] FOR AM LAB REPLACEMENT ONLY  The patient does [facility] based on the criteria listed below:   1. Is GFR >/= 40 ml/min? Yes.    Patient's GFR today is >90 2. Is urine output >/= 0.5 ml/kg/hr for the last 6 hours? Yes.   Patient's UOP is 1.26 ml/kg/hr 3. Is BUN < 60 mg/dL? Yes.    Patient's BUN today is 15 4. Abnormal electrolyte K 3.0 5. Ordered repletion with: per protocol 6. If a panic level lab has been reported, has the CCM MD in charge been notified? Yes.  .   Physician:  Rocco SereneByrum  Brandon Montgomery 10/21/2014 5:19 AM

## 2014-10-21 NOTE — Progress Notes (Signed)
eLink Physician-Brief Progress Note Patient Name: Brandon SchwalbeRobert L Swire DOB: 05/08/1966 MRN: [id number]   Date of Service  10/21/2014  HPI/Events of Note  Quadriplegic male with infection and SIRS.  He has severe hyperthermia.  105 presently and peaked at 107 today.  He is on broad abx.  HR 132 with last systolic BP greater than 100.  eICU Interventions  ABG Dose of tylenol     Intervention Category Intermediate Interventions: Infection - evaluation and management  Henry RusselSMITH, Jah Alarid, P 10/21/2014, 5:47 PM

## 2014-10-21 NOTE — Progress Notes (Signed)
HR 142, BP 89/58, O2 sats 100% on FIO2 of 95%, RR 22 after 100 mcg fentanyl IV. Temperature 103.1, core temperature.   Patient was agitated and restless with BP of 161/132.  Paged Dr. Marchelle Gearingamaswamy, PCCM.

## 2014-10-21 NOTE — Progress Notes (Signed)
ANTIBIOTIC CONSULT NOTE - INITIAL  Pharmacy Consult for vancomycin, Zosyn Indication: HAP  Allergies  Allergen Reactions  . Contrast Media [Iodinated Diagnostic Agents] Anaphylaxis    Patient Measurements: Height: 5\' 8"  (172.7 cm) Weight: 108 lb 3.9 oz (49.1 kg) IBW/kg (Calculated) : 68.4  Vital Signs: Temp: 102.2 F (39 C) (12/13 1200) Temp Source: Core (Comment) (12/13 1130) BP: 158/131 mmHg (12/13 1200) Pulse Rate: 122 (12/13 1200) Intake/Output from previous day: 12/12 0701 - 12/13 0700 In: 2060 [I.V.:590; NG/GT:1470] Out: 2825 [Urine:2750; Stool:75] Intake/Output from this shift: Total I/O In: 80 [I.V.:80] Out: -   Labs:  Recent Labs  10/19/14 0320 10/20/14 0340 10/21/14 0333  WBC 14.4* 13.8* 20.6*  HGB 12.3* 11.7* 13.2  PLT 269 232 297  CREATININE 0.30* 0.27* 0.31*   Estimated Creatinine Clearance: 80.1 mL/min (by C-G formula based on Cr of 0.31). No results for input(s): VANCOTROUGH, VANCOPEAK, VANCORANDOM, GENTTROUGH, GENTPEAK, GENTRANDOM, TOBRATROUGH, TOBRAPEAK, TOBRARND, AMIKACINPEAK, AMIKACINTROU, AMIKACIN in the last 72 hours.   Microbiology: Recent Results (from the past 720 hour(s))  Culture, respiratory (tracheal aspirate)     Status: None   Collection Time: 10/09/14  2:27 AM  Result Value Ref Range Status   Specimen Description TRACHEAL ASPIRATE  Final   Special Requests NONE  Final   Gram Stain   Final    ABUNDANT WBC PRESENT, PREDOMINANTLY PMN NO SQUAMOUS EPITHELIAL CELLS SEEN NO ORGANISMS SEEN Performed at [facility]    Culture   Final    Non-Pathogenic Oropharyngeal-type Flora Isolated. Performed at [facility]    Report Status 10/11/2014 FINAL  Final  MRSA PCR Screening     Status: None   Collection Time: 10/09/14  6:55 AM  Result Value Ref Range Status   MRSA by PCR NEGATIVE NEGATIVE Final    Comment:        The GeneXpert MRSA Assay (FDA approved for NASAL specimens only), is one component of  a comprehensive MRSA colonization surveillance program. It is not intended to diagnose MRSA infection nor to guide or monitor treatment for MRSA infections.   Respiratory virus panel (routine influenza)     Status: None   Collection Time: 10/10/14 11:18 AM  Result Value Ref Range Status   Source - RVPAN NASAL WASHINGS  Corrected    Comment: CORRECTED ON 12/04 AT 2154: PREVIOUSLY REPORTED AS NASAL WASHINGS   Respiratory Syncytial Virus A NOT DETECTED  Final   Respiratory Syncytial Virus B NOT DETECTED  Final   Influenza A NOT DETECTED  Final   Influenza B NOT DETECTED  Final   Parainfluenza 1 NOT DETECTED  Final   Parainfluenza 2 NOT DETECTED  Final   Parainfluenza 3 NOT DETECTED  Final   Metapneumovirus NOT DETECTED  Final   Rhinovirus NOT DETECTED  Final   Adenovirus NOT DETECTED  Final   Influenza A H1 NOT DETECTED  Final   Influenza A H3 NOT DETECTED  Final    Comment: (NOTE)       Normal Reference Range for each Analyte: NOT DETECTED Testing performed using the Luminex xTAG Respiratory Viral Panel test kit. The analytical performance characteristics of this assay have been determined by [facility].  The modifications have not been cleared or approved by the FDA. This assay has been validated pursuant to the CLIA regulations and is used for clinical purposes. Performed at [place] [facility] by PCR     Status: None   Collection Time: 10/16/14  5:11 PM  Result Value Ref Range Status   C difficile by pcr NEGATIVE NEGATIVE Final    Comment: Performed at [facility] History: Past Medical History  Diagnosis Date  . Quadriplegia   . Anxiety   . Tobacco dependence   . Chronic neck pain   . Chronic shoulder pain   . Spinal cord injury, C5-C7     Medications:  Scheduled:  . antiseptic oral rinse  7 mL Mouth Rinse QID  . budesonide (PULMICORT) nebulizer solution  0.5 mg Nebulization BID  . chlorhexidine  15 mL  Mouth Rinse BID  . clonazePAM  1 mg Per Tube BID  . free water  200 mL Per Tube 3 times per day  . furosemide  40 mg Intravenous Once  . guaiFENesin  10 mL Oral Q12H  . heparin  5,000 Units Subcutaneous 3 times per day  . ipratropium  0.5 mg Nebulization Q6H  . levalbuterol  0.63 mg Nebulization 4 times per day  . magnesium sulfate 1 - 4 g bolus IVPB  2 g Intravenous Once  . metoprolol      . metoprolol  5 mg Intravenous Once  . pantoprazole sodium  40 mg Per Tube Q1200  . potassium chloride  40 mEq Per Tube Once   Infusions:  . sodium chloride Stopped (10/17/14 1402)  . dextrose 5 % and 0.45% NaCl 10 mL/hr at 10/20/14 2000  . feeding supplement (VITAL AF 1.2 CAL) Stopped (10/21/14 0600)   Assessment: 48 yo who is quadriplegic brought to ER 11/30 with cough, SOB treated for Rocephin and Zithromax for CAP. Patient now with new fevers, rise in WBC, and possible HAP to start vancomycin and Zosyn per pharmacy protocol  11/30 >> Rocephin >> 12/8 11/30 >> Zithromax >> 12/6 12/13 >> vancomycin >> 12/13 >> Zosyn >>  Tmax 103.1 WBC 20.6 SCr 0.31, CrCl 81  New cultures ordered  Goal of Therapy:  Vancomycin trough level 15-20 mcg/ml  Plan:  1) Vancomycin $RemoveBefor'500mg'[id number]$  IV q12 2) Zosyn 3.375g IV q8 (extended interval infusion)   Adrian Saran, PharmD, BCPS Pager [phone] 10/21/2014 12:38 PM

## 2014-10-21 NOTE — Progress Notes (Signed)
Notified Tempie Donningarolyn Heavrin, mother, that patient's bp is low and that he is requiring 100% O2 on ventilator.
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
## 2014-10-22 ENCOUNTER — Inpatient Hospital Stay (HOSPITAL_COMMUNITY): Payer: Medicare Other

## 2014-10-22 DIAGNOSIS — E46 Unspecified protein-calorie malnutrition: Secondary | ICD-10-CM | POA: Insufficient documentation

## 2014-10-22 LAB — PROCALCITONIN: PROCALCITONIN: 7.76 ng/mL

## 2014-10-22 LAB — CBC WITH DIFFERENTIAL/PLATELET
BASOS ABS: 0 10*3/uL (ref 0.0–0.1)
Basophils Relative: 0 % (ref 0–1)
EOS PCT: 0 % (ref 0–5)
Eosinophils Absolute: 0 10*3/uL (ref 0.0–0.7)
HCT: 43.7 % (ref 39.0–52.0)
Hemoglobin: 14.4 g/dL (ref 13.0–17.0)
LYMPHS ABS: 1.3 10*3/uL (ref 0.7–4.0)
LYMPHS PCT: 6 % — AB (ref 12–46)
MCH: 31.5 pg (ref 26.0–34.0)
MCHC: 33 g/dL (ref 30.0–36.0)
MCV: 95.6 fL (ref 78.0–100.0)
Monocytes Absolute: 2.2 10*3/uL — ABNORMAL HIGH (ref 0.1–1.0)
Monocytes Relative: 10 % (ref 3–12)
NEUTROS ABS: 18.5 10*3/uL — AB (ref 1.7–7.7)
Neutrophils Relative %: 84 % — ABNORMAL HIGH (ref 43–77)
PLATELETS: 264 10*3/uL (ref 150–400)
RBC: 4.57 MIL/uL (ref 4.22–5.81)
RDW: 13.7 % (ref 11.5–15.5)
WBC: 22 10*3/uL — AB (ref 4.0–10.5)

## 2014-10-22 LAB — BASIC METABOLIC PANEL
ANION GAP: 14 (ref 5–15)
BUN: 32 mg/dL — ABNORMAL HIGH (ref 6–23)
CALCIUM: 8.9 mg/dL (ref 8.4–10.5)
CHLORIDE: 103 meq/L (ref 96–112)
CO2: 26 mEq/L (ref 19–32)
Creatinine, Ser: 0.44 mg/dL — ABNORMAL LOW (ref 0.50–1.35)
GFR calc Af Amer: >90 mL/min (ref >90)
GFR calc non Af Amer: >90 mL/min (ref >90)
GLUCOSE: 167 mg/dL — AB (ref 70–99)
POTASSIUM: 4.4 meq/L (ref 3.7–5.3)
SODIUM: 143 meq/L (ref 137–147)

## 2014-10-22 LAB — GLUCOSE, CAPILLARY
GLUCOSE-CAPILLARY: 179 mg/dL — AB (ref 70–99)
Glucose-Capillary: 137 mg/dL — ABNORMAL HIGH (ref 70–99)
Glucose-Capillary: 155 mg/dL — ABNORMAL HIGH (ref 70–99)
Glucose-Capillary: 139 mg/dL — ABNORMAL HIGH (ref 70–99)
Glucose-Capillary: 130 mg/dL — ABNORMAL HIGH (ref 70–99)
Glucose-Capillary: 142 mg/dL — ABNORMAL HIGH (ref 70–99)

## 2014-10-22 LAB — CULTURE, RESPIRATORY W GRAM STAIN

## 2014-10-22 LAB — MAGNESIUM: Magnesium: 2.5 mg/dL (ref 1.5–2.5)

## 2014-10-22 LAB — PHOSPHORUS: PHOSPHORUS: 3.5 mg/dL (ref 2.3–4.6)

## 2014-10-22 LAB — CULTURE, RESPIRATORY
CULTURE: NO GROWTH
Special Requests: NORMAL

## 2014-10-22 LAB — PRO B NATRIURETIC PEPTIDE: Pro B Natriuretic peptide (BNP): 30265 pg/mL — ABNORMAL HIGH (ref 0–125)

## 2014-10-22 MED ORDER — METOPROLOL TARTRATE 25 MG/10 ML ORAL SUSPENSION
25.0000 mg | Freq: Four times a day (QID) | ORAL | Status: DC
Start: 2014-10-22 — End: 2014-10-24
  Administered 2014-10-22: 25 mg
  Filled 2014-10-22 (×12): qty 10

## 2014-10-22 MED ORDER — IBUPROFEN 100 MG/5ML PO SUSP
800.0000 mg | Freq: Three times a day (TID) | ORAL | Status: DC | PRN
  Administered 2014-10-22: 800 mg
  Filled 2014-10-22 (×3): qty 40

## 2014-10-22 MED ORDER — CLONAZEPAM 1 MG PO TABS
1.0000 mg | ORAL_TABLET | ORAL | Status: DC
  Administered 2014-10-22 – 2014-10-24 (×6): 1 mg
  Filled 2014-10-22 (×6): qty 1

## 2014-10-22 NOTE — Progress Notes (Signed)
NUTRITION FOLLOW UP  Intervention:   -Recommend addition of anti-diarrheal -If loose stools continue with addition of anti-diarrheal, consider modifying to Jevity 1.2 formula; recommend 20 ml/hr and advance to goal rate of 70 ml/hr to provide 2016 kcal (92% est protein needs), 93 gram protein (100% est protein needs), 1355 ml free water -RD to continue to monitor  Nutrition Dx:   Inadequate oral intake related to inability to eat as evidenced by NPO status; ongoing  Goal:   TF to meet >/= 90% of their estimated nutrition needs, progressing   Monitor:   TF tolerance, total protein/energy intake, labs, weights, skin integrity  Assessment:   12/01: No family present in room to provide additional food/nutrition hx -Adult enteral protocol ordered. RN setting up Vital High Protein during time of RD assessment. Discussed concern for refeeding risk and conservative advancement rate; RN in agreement and verbalized understanding. RN noted pt will likely continue to require current level of propofol d/t restlessness and agitation -Quadriplegia contributing to muscle wasting and fat loss  12/02: -Vital High Protein infusing at goal rate of 45 ml/hr -Propofol running at slightly lower rate of  8.2 ml/hr, providing 215 kcal/daily -Will continue to monitor to adjust need of TF goal rate -Phos/Mg WNL -Minimal residuals noted, < 10 ml -Wt increased 10 lb since admit, likely r/t to fluid at pt +3L  12/09: -RD discussed pt with RD on 12/09. Pt had been tolerating Vital High Protein at goal rate of 45 ml/hr w/out residuals. Was held for trial of extubation. RN noted pt is cared for by his mother pta, and had recently developed skin breakdown area on sacrum -WOC eval on 12/09 indicated pt with deep tissue injury on sacrum that will likely reveal itself to be a full thickness pressure ulcer -Pt failed extubation. RD received consult to restart TF -Pt + 3 L -CBG elevated but within goal of < 150  mg/dL -Patient is currently intubated on ventilator support MV: 5.8 L/min Temp (24hrs), Avg:104.2 F (40.1 C), Min:99.5 F (37.5 C), Max:107.2 F (41.8 C)   12/14: -S/p trach on 12/11 -Pt with fevers, > 107F -Propofol d/c'd -Vital AF 1.2 infusing at 20 ml/hr; providing 576 kcal, 36 gram protein, 389 ml free water -Per RN, pt with frequent episodes loose stools. C.diff has been r/o twice.  -Consider use of anti-diarrheal; recommended to NP; decision pending -If loose stools continue, and NP decides against anti-diarrheal, consider modifying TF to higher fiber content Jevity 1.2 with goal rate of 70 ml/hr to provide 2016 kcal (92% est kcal needs), and 93 gram protein (100% est protein needs) -Phos/K/Mg WNL -CBG elevated at >160 mg/dL, likely r/t to fevers/inflammatory  Height: Ht Readings from Last 1 Encounters:  10/19/14 5\' 8"  (1.727 m)    Weight Status:   Wt Readings from Last 1 Encounters:  10/22/14 104 lb 8 oz (47.4 kg)  10/09/14 94 lb  Re-estimated needs:  Kcal: 2200 (fevers) Protein: 85-95 gram Fluid: >/= 1500 ml daily  Skin: stg 2 pressure ulcer on sacrum  Diet Order: Diet NPO time specified   Intake/Output Summary (Last 24 hours) at 10/22/14 1035 Last data filed at 10/22/14 0800  Gross per 24 hour  Intake 2197.5 ml  Output   1400 ml  Net  797.5 ml    Last BM: 12/13   Labs:   Recent Labs Lab 10/19/14 0320 10/20/14 0340 10/21/14 0333 10/22/14 0340  NA 139 134* 139 143  K 3.6* 3.7 3.0* 4.4  CL  100 96 97 103  CO2 29 29 30 26   BUN 19 13 15  32*  CREATININE 0.30* 0.27* 0.31* 0.44*  CALCIUM 9.1 8.6 9.1 8.9  MG  --   --  1.8 2.5  PHOS 3.1  --  3.1 3.5  GLUCOSE 105* 106* 125* 167*    CBG (last 3)   Recent Labs  10/22/14 0019 10/22/14 0523 10/22/14 0809  GLUCAP 179* 137* 155*    Scheduled Meds: . antiseptic oral rinse  7 mL Mouth Rinse QID  . budesonide (PULMICORT) nebulizer solution  0.5 mg Nebulization BID  . chlorhexidine  15 mL Mouth  Rinse BID  . clonazePAM  1 mg Per Tube [id number]  . free water  200 mL Per Tube 3 times per day  . guaiFENesin  10 mL Oral Q12H  . heparin  5,000 Units Subcutaneous 3 times per day  . ipratropium  0.5 mg Nebulization Q6H  . levalbuterol  0.63 mg Nebulization 4 times per day  . metoprolol  5 mg Intravenous Once  . metoprolol tartrate  25 mg Per Tube 4 times per day  . piperacillin-tazobactam (ZOSYN)  IV  3.375 g Intravenous Q8H  . potassium chloride  40 mEq Oral BID  . ranitidine  150 mg Oral QHS  . vancomycin  500 mg Intravenous Q12H    Continuous Infusions: . sodium chloride 10 mL/hr at 10/22/14 0605  . dextrose 5 % and 0.45% NaCl 10 mL/hr at 10/20/14 2000  . feeding supplement (VITAL AF 1.2 CAL) 1,000 mL (10/22/14 0800)    Lloyd HugerSarah F Sohana Austell MS RD LDN Clinical Dietitian Pager:[phone]

## 2014-10-22 NOTE — Care Management Note (Signed)
CARE MANAGEMENT NOTE 10/22/2014  Patient:  Brandon Montgomery,Brandon Montgomery   Account Number:  [id number]  Date Initiated:  10/09/2014  Documentation initiated by:  DAVIS,RHONDA  Subjective/Objective Assessment:   CAP and VDRF/48 y.o. M who is a quadriplegic [facility] with cough, congestion, SOB.  In ED, remained hypoxic despite 6L O2, CXR revealed LLL PNA.  Started on BiPAP but ultimately failed and required intubation.     Action/Plan:   TBD poss home once resp status stable/lives with parent.   Anticipated DC Date:  10/24/2014   Anticipated DC Plan:  LONG TERM ACUTE CARE (LTAC)  In-house referral  NA      DC Planning Services  CM consult      PAC Choice  NA   Choice offered to / List presented to:  NA   DME arranged  NA      DME agency  NA     HH arranged  NA      HH agency  NA   Status of service:  In process, will continue to follow Medicare Important Message given?   (If response is "NO", the following Medicare IM given date fields will be blank) Date Medicare IM given:   Medicare IM given by:   Date Additional Medicare IM given:   Additional Medicare IM given by:    Discharge Disposition:    Per UR Regulation:  Reviewed for med. necessity/level of care/duration of stay  If discussed at Long Length of Stay Meetings, dates discussed:   10/16/2014  10/18/2014    Comments:  10/22/14 Sandford Crazeora Jaleel Allen RN,BSN,NCM [phone] CM consult for LTAC.  [facility] pt.  Awaiting [facility] to evaluate pt prior to offering choice. MD states probably will be ready in 48hr.  CM will continue to follow.  [id number]/Rhonda Davis,RN,BSN,CCM: Remains on vent with conversion to trach planned within the 24-48 hours. [id number] and 10/09/2014/[name]. Earlene Plateravis, RN, BSN, CCM: Chart review for medical necessity and patient discharge needs. Case Manager will follow for patient condition changes. 09/21/2014/[name]. Earlene Plateravis, RN, BSN, CCM: CHART NOTE FOR PROGRESSION: failed bipap required  intubation/no previous record on file since 2005.

## 2014-10-22 NOTE — Consult Note (Signed)
WOC wound follow up Wound type: sDTI (currently Unstageable).  Slight changes since last assessment. Events of the weekend noted including trach placement and two days of increased temperature to >107 degrees. Patient is also with fecal incontinence and has had an incontinent episode at the time of my visit. Measurement:Total affected area today measures 8.5cm x 6cm of non-blanching erythema with dark brown/purple center area measuring 4.5cm x 3cm.  Right trochanter with a Stage I (non-blanching erythema) measuring 4cm x 5cm and left IT with a non-blanching area (Stage I) measuring 2cm x 1cm . Wound bed:As described above Drainage (amount, consistency, odor) None. Periwound: As noted, intact, dry skin with areas of blanching erythema and non-blanching erythema.   Dressing procedure/placement/frequency:  Heels intact, hips intact.  Right trochanter as noted above (Stage I) and Left ischial tuberosity with Stage I PrU.  The coccyx and right trochanter are covered with a soft silicone foam dressing, the left IT is treated with moisturizing cream and moisture barrier ointment. Patient continues with turning and repositioning per protocol and whenever incontinence indicates cleansing/bed changes are required.  Pressure redistribution heel boots on.  WOC nursing team will not follow routinely, but will remain available to this patient, the nursing and medical teams  Please re-consult if needed in-between visits. Thanks, Ladona MowLaurie Shekelia Boutin, MSN, RN, GNP, [place]OC[place], CWON-AP [phone])

## 2014-10-22 NOTE — Progress Notes (Signed)
CSW continuing to follow for potential d/c needs.   Pt remains on vent and has had fevers. Per NP, pt will need LTAC, but pt not yet medically stable for LTAC until acute issues of fever resolved.  RNCM updated.  CSW to continue to follow to assist as needed.  Loletta SpecterSuzanna Lucillie Kiesel, MSW, LCSW Clinical Social Work [phone]
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
## 2014-10-23 ENCOUNTER — Inpatient Hospital Stay (HOSPITAL_COMMUNITY): Payer: Medicare Other

## 2014-10-23 LAB — CBC WITH DIFFERENTIAL/PLATELET
Basophils Absolute: 0 10*3/uL (ref 0.0–0.1)
Basophils Relative: 0 % (ref 0–1)
Eosinophils Absolute: 0 10*3/uL (ref 0.0–0.7)
Eosinophils Relative: 0 % (ref 0–5)
HCT: 38.7 % — ABNORMAL LOW (ref 39.0–52.0)
Hemoglobin: 12.1 g/dL — ABNORMAL LOW (ref 13.0–17.0)
Lymphocytes Relative: 7 % — ABNORMAL LOW (ref 12–46)
Lymphs Abs: 1 10*3/uL (ref 0.7–4.0)
MCH: 30.1 pg (ref 26.0–34.0)
MCHC: 31.3 g/dL (ref 30.0–36.0)
MCV: 96.3 fL (ref 78.0–100.0)
Monocytes Absolute: 1.3 10*3/uL — ABNORMAL HIGH (ref 0.1–1.0)
Monocytes Relative: 10 % (ref 3–12)
Neutro Abs: 10.9 10*3/uL — ABNORMAL HIGH (ref 1.7–7.7)
Neutrophils Relative %: 83 % — ABNORMAL HIGH (ref 43–77)
Platelets: 237 10*3/uL (ref 150–400)
RBC: 4.02 MIL/uL — ABNORMAL LOW (ref 4.22–5.81)
RDW: 13.8 % (ref 11.5–15.5)
WBC: 13.2 10*3/uL — ABNORMAL HIGH (ref 4.0–10.5)

## 2014-10-23 LAB — GLUCOSE, CAPILLARY
GLUCOSE-CAPILLARY: 141 mg/dL — AB (ref 70–99)
Glucose-Capillary: 102 mg/dL — ABNORMAL HIGH (ref 70–99)
Glucose-Capillary: 118 mg/dL — ABNORMAL HIGH (ref 70–99)
Glucose-Capillary: 124 mg/dL — ABNORMAL HIGH (ref 70–99)
Glucose-Capillary: 126 mg/dL — ABNORMAL HIGH (ref 70–99)
Glucose-Capillary: 145 mg/dL — ABNORMAL HIGH (ref 70–99)
Glucose-Capillary: 176 mg/dL — ABNORMAL HIGH (ref 70–99)

## 2014-10-23 LAB — BASIC METABOLIC PANEL WITH GFR
Anion gap: 15 (ref 5–15)
BUN: 25 mg/dL — ABNORMAL HIGH (ref 6–23)
CO2: 23 meq/L (ref 19–32)
Calcium: 8.9 mg/dL (ref 8.4–10.5)
Chloride: 104 meq/L (ref 96–112)
Creatinine, Ser: 0.39 mg/dL — ABNORMAL LOW (ref 0.50–1.35)
GFR calc Af Amer: >90 mL/min
GFR calc non Af Amer: >90 mL/min
Glucose, Bld: 166 mg/dL — ABNORMAL HIGH (ref 70–99)
Potassium: 2.5 meq/L — CL (ref 3.7–5.3)
Sodium: 142 meq/L (ref 137–147)

## 2014-10-23 LAB — VANCOMYCIN, TROUGH: Vancomycin Tr: 9.8 ug/mL — ABNORMAL LOW (ref 10.0–20.0)

## 2014-10-23 LAB — PROCALCITONIN: Procalcitonin: 5.52 ng/mL

## 2014-10-23 LAB — MAGNESIUM: Magnesium: 1.9 mg/dL (ref 1.5–2.5)

## 2014-10-23 LAB — PHOSPHORUS: Phosphorus: 2 mg/dL — ABNORMAL LOW (ref 2.3–4.6)

## 2014-10-23 MED ORDER — POTASSIUM CHLORIDE 20 MEQ/15ML (10%) PO SOLN: 40.0000 meq | ORAL | Status: DC

## 2014-10-23 MED ORDER — POTASSIUM CHLORIDE 20 MEQ/15ML (10%) PO SOLN
40.0000 meq | Freq: Two times a day (BID) | ORAL | Status: AC
  Administered 2014-10-23 (×2): 40 meq via ORAL
  Filled 2014-10-23 (×2): qty 30

## 2014-10-23 MED ORDER — LOPERAMIDE HCL 1 MG/5ML PO LIQD
2.0000 mg | ORAL | Status: DC | PRN
  Administered 2014-10-23 – 2014-10-24 (×2): 2 mg
  Filled 2014-10-23 (×3): qty 10

## 2014-10-23 MED ORDER — VITAL AF 1.2 CAL PO LIQD
1000.0000 mL | ORAL | Status: DC
  Administered 2014-10-23: 1000 mL
  Filled 2014-10-23 (×2): qty 1000

## 2014-10-23 MED ORDER — DOXAZOSIN MESYLATE 1 MG PO TABS
1.0000 mg | ORAL_TABLET | Freq: Every day | ORAL | Status: DC
Start: 2014-10-23 — End: 2014-10-24
  Filled 2014-10-23 (×2): qty 1

## 2014-10-23 MED ORDER — VANCOMYCIN HCL 500 MG IV SOLR
500.0000 mg | Freq: Three times a day (TID) | INTRAVENOUS | Status: DC
  Administered 2014-10-23 – 2014-10-24 (×2): 500 mg via INTRAVENOUS
  Filled 2014-10-23 (×2): qty 500

## 2014-10-23 MED ORDER — SODIUM PHOSPHATE 3 MMOLE/ML IV SOLN: 20.0000 mmol | Freq: Once | INTRAVENOUS | Status: DC

## 2014-10-23 MED ORDER — MAGNESIUM SULFATE 2 GM/50ML IV SOLN
2.0000 g | Freq: Once | INTRAVENOUS | Status: AC
  Administered 2014-10-23: 2 g via INTRAVENOUS
  Filled 2014-10-23: qty 50

## 2014-10-23 MED ORDER — FUROSEMIDE 10 MG/ML IJ SOLN
40.0000 mg | Freq: Two times a day (BID) | INTRAMUSCULAR | Status: DC
  Administered 2014-10-23 – 2014-10-24 (×3): 40 mg via INTRAVENOUS
  Filled 2014-10-23 (×3): qty 4

## 2014-10-23 MED ORDER — POTASSIUM PHOSPHATES 15 MMOLE/5ML IV SOLN
30.0000 mmol | Freq: Once | INTRAVENOUS | Status: AC
  Administered 2014-10-23: 30 mmol via INTRAVENOUS
  Filled 2014-10-23: qty 10

## 2014-10-23 NOTE — Progress Notes (Signed)
ANTIBIOTIC CONSULT NOTE - FOLLOW UP  Pharmacy Consult for Vancomycin, Zosyn Indication: HCAP, sepsis  Allergies  Allergen Reactions  . Contrast Media [Iodinated Diagnostic Agents] Anaphylaxis    Patient Measurements: Height: 5\' 8"  (172.7 cm) Weight: 99 lb 3.3 oz (45 kg) IBW/kg (Calculated) : 68.4  Vital Signs: Temp: 95.9 F (35.5 C) (12/15 1400) BP: 85/67 mmHg (12/15 1400) Pulse Rate: 78 (12/15 1400) Intake/Output from previous day: 12/14 0701 - 12/15 0700 In: 1654.2 [I.V.:379.2; NG/GT:840; IV Piggyback:435] Out: 625 [Urine:625] Intake/Output from this shift: Total I/O In: 985 [I.V.:140; NG/GT:220; IV Piggyback:625] Out: 625 [Urine:625]  Labs:  Recent Labs  10/21/14 0333 10/22/14 0340 10/23/14 0350  WBC 20.6* 22.0* 13.2*  HGB 13.2 14.4 12.1*  PLT 297 264 237  CREATININE 0.31* 0.44* 0.39*   Estimated Creatinine Clearance: 73.4 mL/min (by C-G formula based on Cr of 0.39).  Recent Labs  10/23/14 1321  VANCOTROUGH 9.8*     Microbiology: Recent Results (from the past 720 hour(s))  Culture, respiratory (tracheal aspirate)     Status: None   Collection Time: 10/09/14  2:27 AM  Result Value Ref Range Status   Specimen Description TRACHEAL ASPIRATE  Final   Special Requests NONE  Final   Gram Stain   Final    ABUNDANT WBC PRESENT, PREDOMINANTLY PMN NO SQUAMOUS EPITHELIAL CELLS SEEN NO ORGANISMS SEEN Performed at [facility]    Culture   Final    Non-Pathogenic Oropharyngeal-type Flora Isolated. Performed at [facility]    Report Status 10/11/2014 FINAL  Final  MRSA PCR Screening     Status: None   Collection Time: 10/09/14  6:55 AM  Result Value Ref Range Status   MRSA by PCR NEGATIVE NEGATIVE Final    Comment:        The GeneXpert MRSA Assay (FDA approved for NASAL specimens only), is one component of a comprehensive MRSA colonization surveillance program. It is not intended to diagnose MRSA infection nor to guide or monitor  treatment for MRSA infections.   Respiratory virus panel (routine influenza)     Status: None   Collection Time: 10/10/14 11:18 AM  Result Value Ref Range Status   Source - RVPAN NASAL WASHINGS  Corrected    Comment: CORRECTED ON 12/04 AT 2154: PREVIOUSLY REPORTED AS NASAL WASHINGS   Respiratory Syncytial Virus A NOT DETECTED  Final   Respiratory Syncytial Virus B NOT DETECTED  Final   Influenza A NOT DETECTED  Final   Influenza B NOT DETECTED  Final   Parainfluenza 1 NOT DETECTED  Final   Parainfluenza 2 NOT DETECTED  Final   Parainfluenza 3 NOT DETECTED  Final   Metapneumovirus NOT DETECTED  Final   Rhinovirus NOT DETECTED  Final   Adenovirus NOT DETECTED  Final   Influenza A H1 NOT DETECTED  Final   Influenza A H3 NOT DETECTED  Final    Comment: (NOTE)       Normal Reference Range for each Analyte: NOT DETECTED Testing performed using the Luminex xTAG Respiratory Viral Panel test kit. The analytical performance characteristics of this assay have been determined by [facility].  The modifications have not been cleared or approved by the FDA. This assay has been validated pursuant to the CLIA regulations and is used for clinical purposes. Performed at [facility]   Clostridium Difficile by PCR     Status: None   Collection Time: 10/16/14  5:11 PM  Result Value Ref Range Status  C difficile by pcr NEGATIVE NEGATIVE Final    Comment: Performed at [facility]  Culture, respiratory (NON-Expectorated)     Status: None   Collection Time: 10/19/14  8:00 AM  Result Value Ref Range Status   Specimen Description TRACHEAL ASPIRATE  Final   Special Requests Normal  Final   Gram Stain   Final    RARE WBC PRESENT, PREDOMINANTLY PMN NO SQUAMOUS EPITHELIAL CELLS SEEN NO ORGANISMS SEEN Performed at [facility]    Culture   Final    NO GROWTH 2 DAYS Performed at [facility]    Report Status 10/22/2014 FINAL  Final  Clostridium Difficile  by PCR     Status: None   Collection Time: 10/21/14 12:45 PM  Result Value Ref Range Status   C difficile by pcr NEGATIVE NEGATIVE Final    Comment: Performed at [facility]  Culture, blood (routine x 2)     Status: None (Preliminary result)   Collection Time: 10/21/14 12:50 PM  Result Value Ref Range Status   Specimen Description BLOOD LEFT ARM  Final   Special Requests   Final    BOTTLES DRAWN AEROBIC AND ANAEROBIC 10CC BOTH BOTTLES   Culture  Setup Time   Final    10/21/2014 19:25 Performed at [facility]    Culture   Final           BLOOD CULTURE RECEIVED NO GROWTH TO DATE CULTURE WILL BE HELD FOR 5 DAYS BEFORE ISSUING A FINAL NEGATIVE REPORT Performed at [facility]    Report Status PENDING  Incomplete  Culture, blood (routine x 2)     Status: None (Preliminary result)   Collection Time: 10/21/14 12:54 PM  Result Value Ref Range Status   Specimen Description BLOOD RIGHT ARM  Final   Special Requests   Final    BOTTLES DRAWN AEROBIC AND ANAEROBIC 10CC BOTH BOTTLES   Culture  Setup Time   Final    10/21/2014 19:25 Performed at [facility]    Culture   Final           BLOOD CULTURE RECEIVED NO GROWTH TO DATE CULTURE WILL BE HELD FOR 5 DAYS BEFORE ISSUING A FINAL NEGATIVE REPORT Performed at [facility]    Report Status PENDING  Incomplete  Culture, respiratory (NON-Expectorated)     Status: None (Preliminary result)   Collection Time: 10/21/14  1:30 PM  Result Value Ref Range Status   Specimen Description TRACHEAL ASPIRATE  Final   Special Requests Normal  Final   Gram Stain PENDING  Incomplete   Culture NO GROWTH Performed at [facility]   Final   Report Status PENDING  Incomplete    Assessment: 37 yoM admitted 11/30 with ShOB. Pt with hx quadriplegia and recurrent pneumonias. Pt on O2 at home, also with current tobacco abuse. Pharmacy initially consulted to dose ceftriaxone and azithromycin for CAP, but  antibiotic coverage broadened to Vancomycin and Zosyn for coverage of HCAP given new fevers and rise in WBC.    11/30 >> Ceftriaxone >> 12/8 11/30 >> Azithromycin >>  12/6 12/13 >> Vancomycin >> 12/13 >> Zosyn >>  Tmax: 104.2 WBCs: elevated but trending down, 13.2 Renal: SCr 0.39. CrCl 73 CG Vancomycin trough level 9.8 mcg/mL (subtherapeutic)  12/13 trach aspirate: pending 12/13 blood x 2: NGTD 12/13 urine: sent 12/11 trach aspirate: NGF 12/8, 12/13 C. diff: negative 12/2 respiratory virus panel: negative 12/1 trach asp: normal flora  12/1 MRSA PCR: negative  Goal of Therapy:  Vancomycin trough level 15-20 mcg/ml Appropriate antibiotic dosing for renal function Eradication of infection  Plan:   Increase Vancomcyin to $RemoveBefor'500mg'ZifAsITDAtEx$  IV q8h due to trough level < goal.  Will plan to recheck Vancomycin trough level at new steady state, pending duration of therapy.  Continue Zosyn 3.375g IV q8h (infuse over 4 hours)  Continue to monitor renal indices, cultures, clinical course.   Lindell Spar, PharmD, BCPS Pager: [phone] 10/23/2014 3:14 PM

## 2014-10-23 NOTE — Progress Notes (Signed)
Patient's o2 sats 98-100%.  Spoke with Dr Molli KnockYacoub about possible wean of peep overnight.  Per MD, leave peep at 8cm h2o and may readdress with md in morning rounding.

## 2014-10-23 NOTE — Progress Notes (Signed)
eLink Physician-Brief Progress Note Patient Name: Brandon SchwalbeRobert L Montgomery DOB: 07/18/1966 MRN: [id number]   Date of Service  10/23/2014  HPI/Events of Note  Hypokalemia and hypophos  eICU Interventions  Potassium and Phos replaced     Intervention Category Major Interventions: Electrolyte abnormality - evaluation and management  Scarleth Brame 10/23/2014, 4:52 AM

## 2014-10-23 NOTE — Care Management Note (Signed)
    Page 1 of 2   10/24/2014     11:51:10 AM CARE MANAGEMENT NOTE 10/24/2014  Patient:  Brandon Montgomery, Brandon Montgomery   Account Number:  [id number]  Date Initiated:  10/09/2014  Documentation initiated by:  Brandon Montgomery,Brandon Montgomery  Subjective/Objective Assessment:   CAP and VDRF/48 y.o. M who is a quadriplegic brought to [facility] ED 11/30 with cough, congestion, SOB.  In ED, remained hypoxic despite 6L O2, CXR revealed LLL PNA.  Started on BiPAP but ultimately failed and required intubation.     Action/Plan:   TBD poss home once resp status stable/lives with parent.   Anticipated DC Date:  10/24/2014   Anticipated DC Plan:  LONG TERM ACUTE CARE (LTAC)  In-house referral  NA      DC Planning Services  CM consult      PAC Choice  NA   Choice offered to / List presented to:  NA   DME arranged  NA      DME agency  NA     [place] arranged  NA      [place] agency  NA   Status of service:  Completed, signed off Medicare Important Message given?  YES (If response is "NO", the following Medicare IM given date fields will be blank) Date Medicare IM given:  10/24/2014 Medicare IM given by:  [facility] Date Additional Medicare IM given:   Additional Medicare IM given by:    Discharge Disposition:  LONG TERM ACUTE CARE (LTAC)  Per UR Regulation:  Reviewed for med. necessity/level of care/duration of stay  If discussed at [place] of Stay Meetings, dates discussed:   10/16/2014  10/18/2014    Comments:  10/24/14 08:00 CM met with pt in room to confirm [facility] transfer; pt nodded understanding.  CM asked if I should call mother or sister; pt whispered either is fine.  CM notified Caroyn of plan for transfer.  RN given room 5706 and number [phone] to call report. No other CM needs were communicated.  Brandon Montgomery, BSN, Jearld Lesch [phone].  l12/15/15 16:29 CM received call from mother of pt, Brandon Montgomery who took the tour of [facility], was impressed, and requests we move forward with LTAC placement at [facility].  [facility],  director, Konrad Dolores is aware and will call with room number and receiving MD in the am of 10/24/14.  CM notified unit RN. Will continue to follow.  Brandon Montgomery, BSN, [place].  10/23/14 12:15 CM spoke with Keifer Habib (mother of [phone]  who states she prefers [facility] but would still like to tour before final decision.  Brandon Montgomery was given directions to [facility] and verbalized understanding she can go anytime today and will call me after the tour with her decision.  Tommy of [facility] aware and CM called Fraser Din of [facility] to notify of pt choice.   Will continue to monitor.Brandon Montgomery, BSN, [place].  10/22/14 Brandon Doctor RN,BSN,NCM [phone] CM consult for LTAC.  [facility] has bed [facility].  Awaiting [facility] to evaluate pt prior to offering choice. MD states probably will be ready in 48hr.  CM will continue to follow.  [id number] Davis,RN,BSN,CCM: Remains on vent with conversion to trach planned within the 24-48 hours. [id number] and 10/09/2014/Brandon Montgomery L. Rosana Hoes, RN, BSN, CCM: Chart review for medical necessity and patient discharge needs. Case Manager will follow for patient condition changes. 09/21/2014/Brandon Montgomery L. Rosana Hoes, RN, BSN, CCM: CHART NOTE FOR PROGRESSION: failed bipap required intubation/no previous record on file since 2005.

## 2014-10-23 NOTE — Progress Notes (Signed)
Antony OdeaGayle Mueller and Oneita HurtErin Luevenia Mcavoy rounded with patient's mother, Eber JonesCarolyn, to introduce themselves as well as discuss perception of nursing care received by patient. Discussed skin injury that Molly Madurorobert now has. Explained to Eber JonesCarolyn that the cause of the skin injury is multifactorial, resulting from, but not limited to,  poor oxygenation, poor nutrition, and high temperatures. Mother responded that she was very pleased with the care provided to [employer] during this hospitalization and she knows that the nurses are doing everything they can for Eluterio.

## 2014-10-23 NOTE — Progress Notes (Signed)
CARE MANAGEMENT NOTE 10/23/2014  Patient:  Brandon Montgomery,Jakhi L   Account Number:  [id number]  Date Initiated:  10/09/2014  Documentation initiated by:  DAVIS,RHONDA  Subjective/Objective Assessment:   CAP and VDRF/48 y.o. M who is a quadriplegic [facility] with cough, congestion, SOB.  In ED, remained hypoxic despite 6L O2, CXR revealed LLL PNA.  Started on BiPAP but ultimately failed and required intubation.     Action/Plan:   TBD poss home once resp status stable/lives with parent.   Anticipated DC Date:  10/24/2014   Anticipated DC Plan:  LONG TERM ACUTE CARE (LTAC)  In-house referral  NA      DC Planning Services  CM consult      PAC Choice  NA   Choice offered to / List presented to:  NA   DME arranged  NA      DME agency  NA     HH arranged  NA      HH agency  NA   Status of service:  In process, will continue to follow Medicare Important Message given?   (If response is "NO", the following Medicare IM given date fields will be blank) Date Medicare IM given:   Medicare IM given by:   Date Additional Medicare IM given:   Additional Medicare IM given by:    Discharge Disposition:  LONG TERM ACUTE CARE (LTAC)  Per UR Regulation:  Reviewed for med. necessity/level of care/duration of stay  If discussed at Long [facility] of Stay Meetings, dates discussed:   10/16/2014  10/18/2014    Comments:  10/23/14 16:29 CM received call from mother of pt, Eber JonesCarolyn who took the tour of [facility], was impressed, and [facility]  [facility], director, Orvilla Fusommy is aware and will call with room number and receiving MD in the am of 10/24/14.  CM notified unit RN. Will continue to follow.  Freddy JakschSarah  Kamara Allan, BSN, [place] [phone].  10/23/14 12:15 CM spoke with Tempie DonningCarolyn Witman (mother of [phone]  who states she prefers [facility] but would still like to tour before final decision.  Eber JonesCarolyn was given directions to [facility] and verbalized understanding  she can go anytime today and will call me after the tour with her decision.  Tommy of [facility] aware and CM called Dennie Bibleat of [facility] to notify of pt choice.   Will continue to monitor.Tera MaterSsarah Aesha Agrawal, BSN, [place] [phone].  10/22/14 Sandford CrazeNora Clements RN,BSN,NCM [phone] CM consult for LTAC.  [facility] has bed for pt.  Awaiting [facility] to evaluate pt prior to offering choice. MD states probably will be ready in 48hr.  CM will continue to follow.  [id number]/Rhonda Davis,RN,BSN,CCM: Remains on vent with conversion to trach planned within the 24-48 hours. [id number] and 10/09/2014/Rhonda L. Earlene Plateravis, RN, BSN, CCM: Chart review for medical necessity and patient discharge needs. Case Manager will follow for patient condition changes. 09/21/2014/Rhonda L. Earlene Plateravis, RN, BSN, CCM: CHART NOTE FOR PROGRESSION: failed bipap required intubation/no previous record on file since 2005.

## 2014-10-23 NOTE — Progress Notes (Addendum)
NUTRITION FOLLOW UP  Intervention:   -Recommend addition of anti-diarrheal -Due to fevers and propofol d/c, modify Vital AF 1.2 to goal rate of 60 ml/hr to provide 1728 kcal (86% est kcal needs), 108 gram (100% est protein needs), 1167 ml free water -If loose stools continue with addition of anti-diarrheal, consider modifying to Jevity 1.2 formula; recommend 20 ml/hr and advance to goal rate of 70 ml/hr to provide 2016 kcal (92% est protein needs), 93 gram protein (100% est protein needs), 1355 ml free water -RD to continue to monitor  Nutrition Dx:   Inadequate oral intake related to inability to eat as evidenced by NPO status; ongoing  Goal:   TF to meet >/= 90% of their estimated nutrition needs, progressing   Monitor:   TF tolerance, total protein/energy intake, labs, weights, skin integrity  Assessment:   12/01: No family present in room to provide additional food/nutrition hx -Adult enteral protocol ordered. RN setting up Vital High Protein during time of RD assessment. Discussed concern for refeeding risk and conservative advancement rate; RN in agreement and verbalized understanding. RN noted pt will likely continue to require current level of propofol d/t restlessness and agitation -Quadriplegia contributing to muscle wasting and fat loss  12/02: -Vital High Protein infusing at goal rate of 45 ml/hr -Propofol running at slightly lower rate of  8.2 ml/hr, providing 215 kcal/daily -Will continue to monitor to adjust need of TF goal rate -Phos/Mg WNL -Minimal residuals noted, < 10 ml -Wt increased 10 lb since admit, likely r/t to fluid at pt +3L  12/09: -RD discussed pt with RD on 12/09. Pt had been tolerating Vital High Protein at goal rate of 45 ml/hr w/out residuals. Was held for trial of extubation. RN noted pt is cared for by his mother pta, and had recently developed skin breakdown area on sacrum -WOC eval on 12/09 indicated pt with deep tissue injury on sacrum that will  likely reveal itself to be a full thickness pressure ulcer -Pt failed extubation. RD received consult to restart TF -Pt + 3 L -CBG elevated but within goal of < 150 mg/dL -Patient is currently intubated on ventilator support MV: 5.8 L/min Temp (24hrs), Avg:100.9 F (38.3 C), Min:97.7 F (36.5 C), Max:104.4 F (40.2 C)   12/14: -S/p trach on 12/11 -Pt with fevers, > 107F -Propofol d/c'd -Vital AF 1.2 infusing at 20 ml/hr; providing 576 kcal, 36 gram protein, 389 ml free water -Per RN, pt with frequent episodes loose stools. C.diff has been r/o twice.  -Consider use of anti-diarrheal; recommended to NP; decision pending -If loose stools continue, and NP decides against anti-diarrheal, consider modifying TF to higher fiber content Jevity 1.2 with goal rate of 70 ml/hr to provide 2016 kcal (92% est kcal needs), and 93 gram protein (100% est protein needs) -Phos/K/Mg WNL -CBG elevated at >160 mg/dL, likely r/t to fevers/inflammatory  12/15: -Fevers improving -RN note indicated pt pulled out NG tube, was able to be replaced and placement confirmed. Vital AF 1.2 restarted and currently infusing at 30 ml/hr -Phos/K low, being repleted. Modify advancement to more conservative rate; can modify to 10 ml Q4H as tolerated -RN noted pt continues with loose stools, had 2 episodes this morning -Continue to recommend antidiarrheal -Patient is currently intubated on ventilator support MV: 10 L/min Temp (24hrs), Avg:100.9 F (38.3 C), Min:97.7 F (36.5 C), Max:104.4 F (40.2 C)    Height: Ht Readings from Last 1 Encounters:  10/22/14 5\' 8"  (1.727 m)  Weight Status:   Wt Readings from Last 1 Encounters:  10/23/14 99 lb 3.3 oz (45 kg)  10/09/14 94 lb  Re-estimated needs:  Kcal: 2000 (fevers) Protein: 90-105  gram Fluid: >/= 1500 ml daily  Skin: stg 2 pressure ulcer on sacrum  Diet Order: Diet NPO time specified   Intake/Output Summary (Last 24 hours) at 10/23/14 1200 Last data  filed at 10/23/14 1100  Gross per 24 hour  Intake   1780 ml  Output    575 ml  Net   1205 ml    Last BM: 12/14   Labs:   Recent Labs Lab 10/21/14 0333 10/22/14 0340 10/23/14 0350  NA 139 143 142  K 3.0* 4.4 2.5*  CL 97 103 104  CO2 30 26 23   BUN 15 32* 25*  CREATININE 0.31* 0.44* 0.39*  CALCIUM 9.1 8.9 8.9  MG 1.8 2.5 1.9  PHOS 3.1 3.5 2.0*  GLUCOSE 125* 167* 166*    CBG (last 3)   Recent Labs  10/22/14 2350 10/23/14 0520 10/23/14 0819  GLUCAP 176* 141* 145*    Scheduled Meds: . antiseptic oral rinse  7 mL Mouth Rinse QID  . budesonide (PULMICORT) nebulizer solution  0.5 mg Nebulization BID  . chlorhexidine  15 mL Mouth Rinse BID  . clonazePAM  1 mg Per Tube [id number]  . doxazosin  1 mg Per Tube Daily  . free water  200 mL Per Tube 3 times per day  . furosemide  40 mg Intravenous Q12H  . guaiFENesin  10 mL Oral Q12H  . heparin  5,000 Units Subcutaneous 3 times per day  . ipratropium  0.5 mg Nebulization Q6H  . levalbuterol  0.63 mg Nebulization 4 times per day  . magnesium sulfate 1 - 4 g bolus IVPB  2 g Intravenous Once  . metoprolol  5 mg Intravenous Once  . metoprolol tartrate  25 mg Per Tube 4 times per day  . piperacillin-tazobactam (ZOSYN)  IV  3.375 g Intravenous Q8H  . potassium chloride  40 mEq Oral BID  . potassium phosphate IVPB (mmol)  30 mmol Intravenous Once  . ranitidine  150 mg Oral QHS  . vancomycin  500 mg Intravenous Q12H    Continuous Infusions: . sodium chloride 10 mL/hr at 10/22/14 0605  . dextrose 5 % and 0.45% NaCl 10 mL/hr at 10/20/14 2000  . feeding supplement (VITAL AF 1.2 CAL) 1,000 mL (10/22/14 2000)    Lloyd HugerSarah F Dreyden Rohrman MS RD LDN Clinical Dietitian Pager:[phone]

## 2014-10-23 NOTE — Progress Notes (Signed)
CRITICAL VALUE ALERT  Critical value received: Potassium 2.5  Date of notification:  10/23/2014  Time of notification:  0448  Critical value read back yes  Nurse who received alert:  Sonny MastersMichelle R  MD notified (1st page):  Dr. Darrick Pennaeterding  Time of first page:  0448  MD notified (2nd page):none  Time of second page:none  Responding MD:  Deterding  Time MD responded:  [phone]
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
## 2014-10-23 NOTE — Progress Notes (Signed)
Earlier in the night, patient pulled out NG; NG replaced.  Dr. Darrick Pennaeterding read report and stated the new NG is in the distal stomach.  Tube feeds resumed,

## 2014-10-24 ENCOUNTER — Other Ambulatory Visit (HOSPITAL_COMMUNITY): Payer: Self-pay

## 2014-10-24 ENCOUNTER — Inpatient Hospital Stay
Admission: AD | Admit: 2014-10-24 | Discharge: 2014-11-14 | Disposition: A | Discharge status: Skilled Nursing Facility | Payer: Medicare Other | Source: Ambulatory Visit | Attending: Internal Medicine | Admitting: Internal Medicine

## 2014-10-24 DIAGNOSIS — E46 Unspecified protein-calorie malnutrition: Secondary | ICD-10-CM | POA: Diagnosis present

## 2014-10-24 DIAGNOSIS — J9600 Acute respiratory failure, unspecified whether with hypoxia or hypercapnia: Secondary | ICD-10-CM | POA: Diagnosis present

## 2014-10-24 DIAGNOSIS — Z4659 Encounter for fitting and adjustment of other gastrointestinal appliance and device: Secondary | ICD-10-CM

## 2014-10-24 DIAGNOSIS — J9601 Acute respiratory failure with hypoxia: Secondary | ICD-10-CM | POA: Diagnosis present

## 2014-10-24 DIAGNOSIS — G8250 Quadriplegia, unspecified: Secondary | ICD-10-CM | POA: Diagnosis present

## 2014-10-24 DIAGNOSIS — I319 Disease of pericardium, unspecified: Secondary | ICD-10-CM

## 2014-10-24 DIAGNOSIS — R1314 Dysphagia, pharyngoesophageal phase: Secondary | ICD-10-CM | POA: Insufficient documentation

## 2014-10-24 DIAGNOSIS — Z431 Encounter for attention to gastrostomy: Secondary | ICD-10-CM

## 2014-10-24 DIAGNOSIS — Z930 Tracheostomy status: Secondary | ICD-10-CM

## 2014-10-24 DIAGNOSIS — J9690 Respiratory failure, unspecified, unspecified whether with hypoxia or hypercapnia: Secondary | ICD-10-CM | POA: Insufficient documentation

## 2014-10-24 LAB — BLOOD GAS, ARTERIAL
ACID-BASE EXCESS: 4.3 mmol/L — AB (ref 0.0–2.0)
Bicarbonate: 28.7 mEq/L — ABNORMAL HIGH (ref 20.0–24.0)
FIO2: 0.6 %
LHR: 12 {breaths}/min
MECHVT: 410 mL
O2 Saturation: 95.8 %
PCO2 ART: 46.4 mmHg — AB (ref 35.0–45.0)
PEEP/CPAP: 5 cmH2O
Patient temperature: 98.6
TCO2: 30.2 mmol/L (ref 0–100)
pH, Arterial: 7.409 (ref 7.350–7.450)
pO2, Arterial: 90.6 mmHg (ref 80.0–100.0)

## 2014-10-24 LAB — BASIC METABOLIC PANEL
Anion gap: 16 — ABNORMAL HIGH (ref 5–15)
BUN: 25 mg/dL — ABNORMAL HIGH (ref 6–23)
CALCIUM: 9.5 mg/dL (ref 8.4–10.5)
CO2: 28 mEq/L (ref 19–32)
Chloride: 112 mEq/L (ref 96–112)
Creatinine, Ser: 0.55 mg/dL (ref 0.50–1.35)
GFR calc Af Amer: >90 mL/min (ref >90)
GLUCOSE: 126 mg/dL — AB (ref 70–99)
POTASSIUM: 2.6 meq/L — AB (ref 3.7–5.3)
Sodium: 156 mEq/L — ABNORMAL HIGH (ref 137–147)

## 2014-10-24 LAB — URINE CULTURE
CULTURE: NO GROWTH
Colony Count: NO GROWTH
Special Requests: NORMAL

## 2014-10-24 LAB — CBC WITH DIFFERENTIAL/PLATELET
Basophils Absolute: 0 10*3/uL (ref 0.0–0.1)
Basophils Relative: 0 % (ref 0–1)
EOS ABS: 0 10*3/uL (ref 0.0–0.7)
EOS PCT: 0 % (ref 0–5)
HCT: 40.9 % (ref 39.0–52.0)
HEMOGLOBIN: 13.3 g/dL (ref 13.0–17.0)
LYMPHS ABS: 1 10*3/uL (ref 0.7–4.0)
Lymphocytes Relative: 7 % — ABNORMAL LOW (ref 12–46)
MCH: 30.9 pg (ref 26.0–34.0)
MCHC: 32.5 g/dL (ref 30.0–36.0)
MCV: 95.1 fL (ref 78.0–100.0)
Monocytes Absolute: 0.8 10*3/uL (ref 0.1–1.0)
Monocytes Relative: 6 % (ref 3–12)
Neutro Abs: 11.6 10*3/uL — ABNORMAL HIGH (ref 1.7–7.7)
Neutrophils Relative %: 87 % — ABNORMAL HIGH (ref 43–77)
Platelets: 238 10*3/uL (ref 150–400)
RBC: 4.3 MIL/uL (ref 4.22–5.81)
RDW: 13.7 % (ref 11.5–15.5)
WBC: 13.4 10*3/uL — ABNORMAL HIGH (ref 4.0–10.5)

## 2014-10-24 LAB — CULTURE, RESPIRATORY W GRAM STAIN
Culture: NO GROWTH
Gram Stain: NONE SEEN

## 2014-10-24 LAB — GLUCOSE, CAPILLARY
GLUCOSE-CAPILLARY: 117 mg/dL — AB (ref 70–99)
Glucose-Capillary: 114 mg/dL — ABNORMAL HIGH (ref 70–99)

## 2014-10-24 LAB — PHOSPHORUS: Phosphorus: 4.3 mg/dL (ref 2.3–4.6)

## 2014-10-24 LAB — CULTURE, RESPIRATORY: SPECIAL REQUESTS: NORMAL

## 2014-10-24 LAB — POTASSIUM: Potassium: 5.4 mEq/L — ABNORMAL HIGH (ref 3.7–5.3)

## 2014-10-24 LAB — MAGNESIUM: Magnesium: 2.3 mg/dL (ref 1.5–2.5)

## 2014-10-24 MED ORDER — PIPERACILLIN-TAZOBACTAM 3.375 G IVPB: 3.3750 g | Freq: Three times a day (TID) | INTRAVENOUS | Status: AC

## 2014-10-24 MED ORDER — SODIUM CHLORIDE 0.9 % IV SOLN: INTRAVENOUS | Status: AC

## 2014-10-24 MED ORDER — FENTANYL CITRATE 0.05 MG/ML IJ SOLN: 25.0000 ug | INTRAMUSCULAR | Status: AC | PRN

## 2014-10-24 MED ORDER — POTASSIUM CHLORIDE 20 MEQ/15ML (10%) PO SOLN: 40.0000 meq | ORAL | Status: AC

## 2014-10-24 MED ORDER — LOPERAMIDE HCL 1 MG/5ML PO LIQD: 2.0000 mg | ORAL | Status: AC | PRN

## 2014-10-24 MED ORDER — POTASSIUM CHLORIDE 20 MEQ/15ML (10%) PO SOLN
40.0000 meq | ORAL | Status: DC
  Administered 2014-10-24 (×2): 40 meq
  Filled 2014-10-24 (×3): qty 30

## 2014-10-24 MED ORDER — METOPROLOL TARTRATE 25 MG/10 ML ORAL SUSPENSION: 25.0000 mg | Freq: Four times a day (QID) | ORAL | Status: AC

## 2014-10-24 MED ORDER — SACCHAROMYCES BOULARDII 250 MG PO CAPS: 250.0000 mg | ORAL_CAPSULE | Freq: Two times a day (BID) | ORAL | Status: AC

## 2014-10-24 MED ORDER — VITAL AF 1.2 CAL PO LIQD: 1000.0000 mL | ORAL | Status: AC

## 2014-10-24 MED ORDER — FREE WATER: 200.0000 mL | Status: AC

## 2014-10-24 MED ORDER — HEPARIN SODIUM (PORCINE) 5000 UNIT/ML IJ SOLN: 5000.0000 [IU] | Freq: Three times a day (TID) | INTRAMUSCULAR | Status: AC

## 2014-10-24 MED ORDER — IPRATROPIUM BROMIDE 0.02 % IN SOLN: 0.5000 mg | Freq: Four times a day (QID) | RESPIRATORY_TRACT | Status: AC

## 2014-10-24 MED ORDER — BUDESONIDE 0.5 MG/2ML IN SUSP: 0.5000 mg | Freq: Two times a day (BID) | RESPIRATORY_TRACT | Status: AC

## 2014-10-24 MED ORDER — RANITIDINE HCL 150 MG/10ML PO SYRP: 150.0000 mg | ORAL_SOLUTION | Freq: Every day | ORAL | Status: AC

## 2014-10-24 MED ORDER — ACETAMINOPHEN 160 MG/5ML PO SOLN: 650.0000 mg | Freq: Four times a day (QID) | ORAL | Status: AC | PRN

## 2014-10-24 MED ORDER — LEVALBUTEROL HCL 0.63 MG/3ML IN NEBU: 0.6300 mg | INHALATION_SOLUTION | Freq: Four times a day (QID) | RESPIRATORY_TRACT | Status: AC

## 2014-10-24 MED ORDER — ALBUTEROL SULFATE (2.5 MG/3ML) 0.083% IN NEBU: 2.5000 mg | INHALATION_SOLUTION | RESPIRATORY_TRACT | Status: AC | PRN

## 2014-10-24 MED ORDER — GUAIFENESIN 100 MG/5ML PO SOLN: 10.0000 mL | Freq: Two times a day (BID) | ORAL | Status: AC

## 2014-10-24 MED ORDER — DOXAZOSIN MESYLATE 1 MG PO TABS: 1.0000 mg | ORAL_TABLET | Freq: Every day | ORAL | Status: AC

## 2014-10-24 MED ORDER — CLONAZEPAM 1 MG PO TABS: 1.0000 mg | ORAL_TABLET | ORAL | Status: AC

## 2014-10-24 MED ORDER — CETYLPYRIDINIUM CHLORIDE 0.05 % MT LIQD: 7.0000 mL | Freq: Four times a day (QID) | OROMUCOSAL | Status: AC

## 2014-10-24 MED ORDER — FREE WATER
200.0000 mL | Status: DC
  Administered 2014-10-24: 200 mL

## 2014-10-24 MED ORDER — CHLORHEXIDINE GLUCONATE 0.12 % MT SOLN: 15.0000 mL | Freq: Two times a day (BID) | OROMUCOSAL | Status: AC
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
## 2014-10-24 NOTE — Progress Notes (Signed)
CSW continuing to follow.  CSW reviewed chart and noted that [facility] and plan is for [facility]. RNCM facilitating [facility].  Full psychosocial assessment not warranted [facility]  No further social work needs identified at this time.  CSW signing off.   Brandon SpecterSuzanna Montgomery, MSW, LCSW Clinical Social Work [phone]

## 2014-10-24 NOTE — Progress Notes (Signed)
[facility] FOR AM LAB REPLACEMENT ONLY  The patient does [facility] Protocol based on the criteria listed below:   1. Is GFR >/= 40 ml/min? Yes.    Patient's GFR today is >90 2. Is urine output >/= 0.5 ml/kg/hr for the last 6 hours? Yes.   Patient's UOP is 4.1 ml/kg/hr 3. Is BUN < 60 mg/dL? Yes.    Patient's BUN today is 25 4. Abnormal electrolyte K 2.6 5. Ordered repletion with: per protocol 6. If a panic level lab has been reported, has the CCM MD in charge been notified? Yes.  .   Physician:  Lawerance SabalYacoub  Alyzza Andringa [facility] 10/24/2014 5:24 AM

## 2014-10-24 NOTE — Progress Notes (Signed)
  Echocardiogram 2D Echocardiogram has been performed.  Porshea Janowski FRANCES 10/24/2014, 10:45 AM
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
## 2014-10-25 ENCOUNTER — Other Ambulatory Visit (HOSPITAL_COMMUNITY): Payer: Self-pay

## 2014-10-25 ENCOUNTER — Encounter: Payer: Self-pay | Admitting: Pulmonary Disease

## 2014-10-25 DIAGNOSIS — G8250 Quadriplegia, unspecified: Secondary | ICD-10-CM

## 2014-10-25 DIAGNOSIS — J9601 Acute respiratory failure with hypoxia: Secondary | ICD-10-CM

## 2014-10-25 DIAGNOSIS — Z930 Tracheostomy status: Secondary | ICD-10-CM

## 2014-10-25 LAB — COMPREHENSIVE METABOLIC PANEL
ALT: 38 U/L (ref 0–53)
AST: 25 U/L (ref 0–37)
Albumin: 2.8 g/dL — ABNORMAL LOW (ref 3.5–5.2)
Alkaline Phosphatase: 85 U/L (ref 39–117)
Anion gap: 12 (ref 5–15)
BUN: 29 mg/dL — ABNORMAL HIGH (ref 6–23)
CALCIUM: 9.3 mg/dL (ref 8.4–10.5)
CO2: 30 mEq/L (ref 19–32)
CREATININE: 0.5 mg/dL (ref 0.50–1.35)
Chloride: 114 mEq/L — ABNORMAL HIGH (ref 96–112)
GFR calc Af Amer: >90 mL/min (ref >90)
Glucose, Bld: 167 mg/dL — ABNORMAL HIGH (ref 70–99)
Potassium: 3 mEq/L — ABNORMAL LOW (ref 3.7–5.3)
Sodium: 156 mEq/L — ABNORMAL HIGH (ref 137–147)
TOTAL PROTEIN: 6.1 g/dL (ref 6.0–8.3)
Total Bilirubin: 0.5 mg/dL (ref 0.3–1.2)

## 2014-10-25 LAB — CBC
HCT: 41.6 % (ref 39.0–52.0)
Hemoglobin: 13.1 g/dL (ref 13.0–17.0)
MCH: 30.3 pg (ref 26.0–34.0)
MCHC: 31.5 g/dL (ref 30.0–36.0)
MCV: 96.3 fL (ref 78.0–100.0)
PLATELETS: 251 10*3/uL (ref 150–400)
RBC: 4.32 MIL/uL (ref 4.22–5.81)
RDW: 13.9 % (ref 11.5–15.5)
WBC: 15 10*3/uL — ABNORMAL HIGH (ref 4.0–10.5)

## 2014-10-25 LAB — VANCOMYCIN, TROUGH: VANCOMYCIN TR: 13.1 ug/mL (ref 10.0–20.0)

## 2014-10-25 LAB — TSH: TSH: 0.566 u[IU]/mL (ref 0.350–4.500)
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
## 2014-10-26 LAB — BASIC METABOLIC PANEL
ANION GAP: 11 (ref 5–15)
BUN: 25 mg/dL — ABNORMAL HIGH (ref 6–23)
CO2: 34 meq/L — AB (ref 19–32)
CREATININE: 0.5 mg/dL (ref 0.50–1.35)
Calcium: 9.1 mg/dL (ref 8.4–10.5)
Chloride: 102 mEq/L (ref 96–112)
GFR calc non Af Amer: >90 mL/min (ref >90)
Glucose, Bld: 127 mg/dL — ABNORMAL HIGH (ref 70–99)
Potassium: 3.3 mEq/L — ABNORMAL LOW (ref 3.7–5.3)
SODIUM: 147 meq/L (ref 137–147)

## 2014-10-26 LAB — GLUCOSE, CAPILLARY: GLUCOSE-CAPILLARY: 117 mg/dL — AB (ref 70–99)

## 2014-10-27 LAB — CULTURE, BLOOD (ROUTINE X 2)
CULTURE: NO GROWTH
Culture: NO GROWTH

## 2014-10-27 LAB — BASIC METABOLIC PANEL
ANION GAP: 11 (ref 5–15)
BUN: 27 mg/dL — AB (ref 6–23)
CHLORIDE: 92 meq/L — AB (ref 96–112)
CO2: 37 mEq/L — ABNORMAL HIGH (ref 19–32)
Calcium: 9.8 mg/dL (ref 8.4–10.5)
Creatinine, Ser: 0.51 mg/dL (ref 0.50–1.35)
Glucose, Bld: 107 mg/dL — ABNORMAL HIGH (ref 70–99)
Potassium: 4.6 mEq/L (ref 3.7–5.3)
Sodium: 140 mEq/L (ref 137–147)

## 2014-10-29 ENCOUNTER — Other Ambulatory Visit (HOSPITAL_COMMUNITY): Payer: Self-pay

## 2014-10-29 LAB — CBC WITH DIFFERENTIAL/PLATELET
BASOS ABS: 0 10*3/uL (ref 0.0–0.1)
BASOS PCT: 0 % (ref 0–1)
EOS ABS: 0.3 10*3/uL (ref 0.0–0.7)
Eosinophils Relative: 1 % (ref 0–5)
HCT: 45.8 % (ref 39.0–52.0)
Hemoglobin: 14.4 g/dL (ref 13.0–17.0)
Lymphocytes Relative: 8 % — ABNORMAL LOW (ref 12–46)
Lymphs Abs: 1.6 10*3/uL (ref 0.7–4.0)
MCH: 30.2 pg (ref 26.0–34.0)
MCHC: 31.4 g/dL (ref 30.0–36.0)
MCV: 96 fL (ref 78.0–100.0)
Monocytes Absolute: 1.6 10*3/uL — ABNORMAL HIGH (ref 0.1–1.0)
Monocytes Relative: 8 % (ref 3–12)
NEUTROS ABS: 16.6 10*3/uL — AB (ref 1.7–7.7)
NEUTROS PCT: 83 % — AB (ref 43–77)
PLATELETS: 290 10*3/uL (ref 150–400)
RBC: 4.77 MIL/uL (ref 4.22–5.81)
RDW: 13.2 % (ref 11.5–15.5)
WBC: 20.1 10*3/uL — ABNORMAL HIGH (ref 4.0–10.5)

## 2014-10-29 LAB — URINE MICROSCOPIC-ADD ON

## 2014-10-29 LAB — URINALYSIS, ROUTINE W REFLEX MICROSCOPIC
BILIRUBIN URINE: NEGATIVE
GLUCOSE, UA: NEGATIVE mg/dL
Ketones, ur: NEGATIVE mg/dL
Nitrite: NEGATIVE
PROTEIN: NEGATIVE mg/dL
Specific Gravity, Urine: 1.011 (ref 1.005–1.030)
Urobilinogen, UA: 0.2 mg/dL (ref 0.0–1.0)
pH: 6.5 (ref 5.0–8.0)

## 2014-10-29 LAB — CBC
HCT: 46.2 % (ref 39.0–52.0)
Hemoglobin: 15 g/dL (ref 13.0–17.0)
MCH: 30.8 pg (ref 26.0–34.0)
MCHC: 32.5 g/dL (ref 30.0–36.0)
MCV: 94.9 fL (ref 78.0–100.0)
PLATELETS: 296 10*3/uL (ref 150–400)
RBC: 4.87 MIL/uL (ref 4.22–5.81)
RDW: 13.1 % (ref 11.5–15.5)
WBC: 19.9 10*3/uL — ABNORMAL HIGH (ref 4.0–10.5)
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
## 2014-10-30 ENCOUNTER — Other Ambulatory Visit (HOSPITAL_COMMUNITY): Payer: Self-pay

## 2014-10-30 LAB — URINE CULTURE: Colony Count: 50000

## 2014-10-30 LAB — BLOOD GAS, ARTERIAL
Acid-Base Excess: 16.8 mmol/L — ABNORMAL HIGH (ref 0.0–2.0)
BICARBONATE: 42.3 meq/L — AB (ref 20.0–24.0)
FIO2: 0.7 %
O2 Saturation: 93.1 %
PH ART: 7.421 (ref 7.350–7.450)
PO2 ART: 72.6 mmHg — AB (ref 80.0–100.0)
Patient temperature: 98.6
TCO2: 44.3 mmol/L (ref 0–100)
pCO2 arterial: 66.3 mmHg (ref 35.0–45.0)

## 2014-10-31 ENCOUNTER — Other Ambulatory Visit (HOSPITAL_COMMUNITY): Payer: Self-pay

## 2014-10-31 LAB — CBC
HCT: 46 % (ref 39.0–52.0)
HEMOGLOBIN: 14.8 g/dL (ref 13.0–17.0)
MCH: 30.3 pg (ref 26.0–34.0)
MCHC: 32.2 g/dL (ref 30.0–36.0)
MCV: 94.1 fL (ref 78.0–100.0)
Platelets: 304 10*3/uL (ref 150–400)
RBC: 4.89 MIL/uL (ref 4.22–5.81)
RDW: 13.1 % (ref 11.5–15.5)
WBC: 19 10*3/uL — ABNORMAL HIGH (ref 4.0–10.5)

## 2014-10-31 LAB — BASIC METABOLIC PANEL
Anion gap: 17 — ABNORMAL HIGH (ref 5–15)
BUN: 54 mg/dL — ABNORMAL HIGH (ref 6–23)
CO2: 35 mmol/L — AB (ref 19–32)
Calcium: 9.2 mg/dL (ref 8.4–10.5)
Chloride: 83 mEq/L — ABNORMAL LOW (ref 96–112)
Creatinine, Ser: 0.77 mg/dL (ref 0.50–1.35)
GFR calc Af Amer: >90 mL/min (ref >90)
GLUCOSE: 143 mg/dL — AB (ref 70–99)
Potassium: 3.4 mmol/L — ABNORMAL LOW (ref 3.5–5.1)
SODIUM: 135 mmol/L (ref 135–145)

## 2014-10-31 LAB — CLOSTRIDIUM DIFFICILE BY PCR: CDIFFPCR: NEGATIVE

## 2014-10-31 NOTE — Progress Notes (Signed)
IR PA aware of request for percutaneous gastrostomy tube, will order CT to evaluate for anatomy prior to placement and patient with UTI and leukocytosis, will wait until wbc trending down. Plan for tentative Monday 12/28 if CT ok, labs ok and afebrile. Will make NPO and hold sq heparin for 12/28, d/w with RN today.   Pattricia BossKoreen Stella Encarnacion PA-C Interventional Radiology  10/31/14  10:10 AM

## 2014-11-01 ENCOUNTER — Other Ambulatory Visit (HOSPITAL_COMMUNITY): Payer: Self-pay

## 2014-11-01 LAB — BASIC METABOLIC PANEL
Anion gap: 12 (ref 5–15)
BUN: 58 mg/dL — ABNORMAL HIGH (ref 6–23)
CO2: 41 mmol/L (ref 19–32)
Calcium: 9.1 mg/dL (ref 8.4–10.5)
Chloride: 86 mEq/L — ABNORMAL LOW (ref 96–112)
Creatinine, Ser: 0.69 mg/dL (ref 0.50–1.35)
GFR calc non Af Amer: >90 mL/min (ref >90)
GLUCOSE: 120 mg/dL — AB (ref 70–99)
POTASSIUM: 3 mmol/L — AB (ref 3.5–5.1)
SODIUM: 139 mmol/L (ref 135–145)

## 2014-11-02 LAB — POTASSIUM: POTASSIUM: 4 mmol/L (ref 3.5–5.1)

## 2014-11-04 LAB — CBC WITH DIFFERENTIAL/PLATELET
Basophils Absolute: 0 K/uL (ref 0.0–0.1)
Basophils Relative: 0 % (ref 0–1)
Eosinophils Absolute: 0.1 K/uL (ref 0.0–0.7)
Eosinophils Relative: 1 % (ref 0–5)
HCT: 40.8 % (ref 39.0–52.0)
Hemoglobin: 13.2 g/dL (ref 13.0–17.0)
Lymphocytes Relative: 9 % — ABNORMAL LOW (ref 12–46)
Lymphs Abs: 1.5 K/uL (ref 0.7–4.0)
MCH: 31.1 pg (ref 26.0–34.0)
MCHC: 32.4 g/dL (ref 30.0–36.0)
MCV: 96 fL (ref 78.0–100.0)
Monocytes Absolute: 1.2 K/uL — ABNORMAL HIGH (ref 0.1–1.0)
Monocytes Relative: 7 % (ref 3–12)
Neutro Abs: 13.6 K/uL — ABNORMAL HIGH (ref 1.7–7.7)
Neutrophils Relative %: 83 % — ABNORMAL HIGH (ref 43–77)
Platelets: 291 K/uL (ref 150–400)
RBC: 4.25 MIL/uL (ref 4.22–5.81)
RDW: 13.5 % (ref 11.5–15.5)
WBC: 16.5 K/uL — ABNORMAL HIGH (ref 4.0–10.5)

## 2014-11-05 ENCOUNTER — Other Ambulatory Visit (HOSPITAL_COMMUNITY): Payer: Self-pay

## 2014-11-05 DIAGNOSIS — J9690 Respiratory failure, unspecified, unspecified whether with hypoxia or hypercapnia: Secondary | ICD-10-CM | POA: Insufficient documentation

## 2014-11-05 DIAGNOSIS — R1314 Dysphagia, pharyngoesophageal phase: Secondary | ICD-10-CM | POA: Insufficient documentation

## 2014-11-05 LAB — CBC
HCT: 44.8 % (ref 39.0–52.0)
Hemoglobin: 14.7 g/dL (ref 13.0–17.0)
MCH: 31.3 pg (ref 26.0–34.0)
MCHC: 32.8 g/dL (ref 30.0–36.0)
MCV: 95.3 fL (ref 78.0–100.0)
Platelets: 281 10*3/uL (ref 150–400)
RBC: 4.7 MIL/uL (ref 4.22–5.81)
RDW: 13.7 % (ref 11.5–15.5)
WBC: 15.9 10*3/uL — ABNORMAL HIGH (ref 4.0–10.5)

## 2014-11-05 LAB — CULTURE, RESPIRATORY

## 2014-11-05 LAB — CULTURE, RESPIRATORY W GRAM STAIN
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
## 2014-11-06 ENCOUNTER — Other Ambulatory Visit (HOSPITAL_COMMUNITY): Payer: Self-pay

## 2014-11-06 LAB — CULTURE, BLOOD (ROUTINE X 2)
Culture: NO GROWTH
Culture: NO GROWTH

## 2014-11-06 LAB — CBC
HCT: 41.2 % (ref 39.0–52.0)
Hemoglobin: 12.8 g/dL — ABNORMAL LOW (ref 13.0–17.0)
MCH: 29.5 pg (ref 26.0–34.0)
MCHC: 31.1 g/dL (ref 30.0–36.0)
MCV: 94.9 fL (ref 78.0–100.0)
PLATELETS: 283 10*3/uL (ref 150–400)
RBC: 4.34 MIL/uL (ref 4.22–5.81)
RDW: 13.5 % (ref 11.5–15.5)
WBC: 13.3 10*3/uL — ABNORMAL HIGH (ref 4.0–10.5)

## 2014-11-06 LAB — APTT: aPTT: 34 seconds (ref 24–37)

## 2014-11-06 LAB — BASIC METABOLIC PANEL
ANION GAP: 13 (ref 5–15)
BUN: 22 mg/dL (ref 6–23)
CHLORIDE: 90 meq/L — AB (ref 96–112)
CO2: 34 mmol/L — ABNORMAL HIGH (ref 19–32)
CREATININE: 0.59 mg/dL (ref 0.50–1.35)
Calcium: 9.3 mg/dL (ref 8.4–10.5)
GFR calc non Af Amer: >90 mL/min (ref >90)
Glucose, Bld: 99 mg/dL (ref 70–99)
POTASSIUM: 3.6 mmol/L (ref 3.5–5.1)
Sodium: 137 mmol/L (ref 135–145)

## 2014-11-06 LAB — PROTIME-INR
INR: 0.97 (ref 0.00–1.49)
Prothrombin Time: 13 seconds (ref 11.6–15.2)

## 2014-11-06 MED ORDER — LIDOCAINE HCL 1 % IJ SOLN
INTRAMUSCULAR | Status: AC
  Filled 2014-11-06: qty 20

## 2014-11-06 MED ORDER — CEFAZOLIN SODIUM-DEXTROSE 2-3 GM-% IV SOLR
INTRAVENOUS | Status: AC
  Administered 2014-11-06: 10:00:00
  Filled 2014-11-06: qty 50

## 2014-11-06 MED ORDER — GLUCAGON HCL RDNA (DIAGNOSTIC) 1 MG IJ SOLR
INTRAMUSCULAR | Status: AC
  Filled 2014-11-06: qty 1

## 2014-11-06 MED ORDER — IOHEXOL 300 MG/ML  SOLN
50.0000 mL | Freq: Once | INTRAMUSCULAR | Status: AC | PRN
  Administered 2014-11-06: 20 mL

## 2014-11-06 MED ORDER — FENTANYL CITRATE 0.05 MG/ML IJ SOLN
INTRAMUSCULAR | Status: AC
  Filled 2014-11-06: qty 2

## 2014-11-06 MED ORDER — MIDAZOLAM HCL 2 MG/2ML IJ SOLN
INTRAMUSCULAR | Status: AC
  Filled 2014-11-06: qty 2

## 2014-11-06 MED ORDER — FENTANYL CITRATE 0.05 MG/ML IJ SOLN
INTRAMUSCULAR | Status: AC | PRN
  Administered 2014-11-06: 25 ug via INTRAVENOUS

## 2014-11-06 MED ORDER — MIDAZOLAM HCL 2 MG/2ML IJ SOLN
INTRAMUSCULAR | Status: AC | PRN
  Administered 2014-11-06: 0.5 mg via INTRAVENOUS
  Administered 2014-11-06: 1 mg via INTRAVENOUS

## 2014-11-06 MED ORDER — GLUCAGON HCL (RDNA) 1 MG IJ SOLR
INTRAMUSCULAR | Status: AC | PRN
  Administered 2014-11-06: 1 mg via INTRAVENOUS

## 2014-11-06 NOTE — Sedation Documentation (Signed)
Patient denies pain and is resting comfortably.  

## 2014-11-06 NOTE — Procedures (Signed)
Interventional Radiology Procedure Note  Procedure: Placement of percutaneous 53F pull-through gastrostomy tube. Complications: None Recommendations: - NPO except for sips and chips remainder of today and overnight - Maintain G-tube to LWS until tomorrow morning  - NPO except for sips and chips overnight - May advance diet as tolerated and begin using tube tomorrow at 10:00 am  Signed,  Sterling BigHeath K. McCullough, MD

## 2014-11-07 ENCOUNTER — Other Ambulatory Visit (HOSPITAL_COMMUNITY): Payer: Medicare Other

## 2014-11-08 LAB — CBC
HCT: 36.8 % — ABNORMAL LOW (ref 39.0–52.0)
HEMOGLOBIN: 11.8 g/dL — AB (ref 13.0–17.0)
MCH: 29.9 pg (ref 26.0–34.0)
MCHC: 32.1 g/dL (ref 30.0–36.0)
MCV: 93.2 fL (ref 78.0–100.0)
Platelets: 196 10*3/uL (ref 150–400)
RBC: 3.95 MIL/uL — AB (ref 4.22–5.81)
RDW: 13.5 % (ref 11.5–15.5)
WBC: 13.5 10*3/uL — ABNORMAL HIGH (ref 4.0–10.5)

## 2014-11-08 LAB — BASIC METABOLIC PANEL
ANION GAP: 12 (ref 5–15)
BUN: 13 mg/dL (ref 6–23)
CHLORIDE: 88 meq/L — AB (ref 96–112)
CO2: 33 mmol/L — AB (ref 19–32)
Calcium: 9.1 mg/dL (ref 8.4–10.5)
Creatinine, Ser: 0.6 mg/dL (ref 0.50–1.35)
GFR calc non Af Amer: >90 mL/min (ref >90)
Glucose, Bld: 110 mg/dL — ABNORMAL HIGH (ref 70–99)
Potassium: 2.9 mmol/L — ABNORMAL LOW (ref 3.5–5.1)
Sodium: 133 mmol/L — ABNORMAL LOW (ref 135–145)

## 2014-11-09 LAB — BASIC METABOLIC PANEL
Anion gap: 9 (ref 5–15)
BUN: 12 mg/dL (ref 6–23)
CHLORIDE: 93 meq/L — AB (ref 96–112)
CO2: 32 mmol/L (ref 19–32)
CREATININE: 0.51 mg/dL (ref 0.50–1.35)
Calcium: 8.9 mg/dL (ref 8.4–10.5)
GFR calc non Af Amer: >90 mL/min (ref >90)
Glucose, Bld: 104 mg/dL — ABNORMAL HIGH (ref 70–99)
POTASSIUM: 5.7 mmol/L — AB (ref 3.5–5.1)
Sodium: 134 mmol/L — ABNORMAL LOW (ref 135–145)

## 2014-11-09 LAB — POTASSIUM
POTASSIUM: 5.7 mmol/L — AB (ref 3.5–5.1)
Potassium: 4.4 mmol/L (ref 3.5–5.1)

## 2014-11-10 LAB — POTASSIUM
POTASSIUM: 4.6 mmol/L (ref 3.5–5.1)
Potassium: 5.8 mmol/L — ABNORMAL HIGH (ref 3.5–5.1)

## 2014-11-12 DIAGNOSIS — J9620 Acute and chronic respiratory failure, unspecified whether with hypoxia or hypercapnia: Secondary | ICD-10-CM

## 2014-11-12 DIAGNOSIS — E46 Unspecified protein-calorie malnutrition: Secondary | ICD-10-CM
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
## 2014-11-14 LAB — CLOSTRIDIUM DIFFICILE BY PCR: Toxigenic C. Difficile by PCR: NEGATIVE

## 2014-11-15 ENCOUNTER — Non-Acute Institutional Stay (SKILLED_NURSING_FACILITY): Payer: Medicare Other | Admitting: Internal Medicine

## 2014-11-15 DIAGNOSIS — G8250 Quadriplegia, unspecified: Secondary | ICD-10-CM

## 2014-11-15 DIAGNOSIS — E43 Unspecified severe protein-calorie malnutrition: Secondary | ICD-10-CM

## 2014-11-15 DIAGNOSIS — R1314 Dysphagia, pharyngoesophageal phase: Secondary | ICD-10-CM

## 2014-11-15 DIAGNOSIS — J9621 Acute and chronic respiratory failure with hypoxia: Secondary | ICD-10-CM

## 2014-11-15 DIAGNOSIS — Z930 Tracheostomy status: Secondary | ICD-10-CM

## 2014-11-15 DIAGNOSIS — J440 Chronic obstructive pulmonary disease with acute lower respiratory infection: Secondary | ICD-10-CM

## 2014-11-15 DIAGNOSIS — B3749 Other urogenital candidiasis: Secondary | ICD-10-CM

## 2014-11-15 DIAGNOSIS — L89159 Pressure ulcer of sacral region, unspecified stage: Secondary | ICD-10-CM

## 2014-11-15 DIAGNOSIS — Z931 Gastrostomy status: Secondary | ICD-10-CM

## 2014-11-15 DIAGNOSIS — I214 Non-ST elevation (NSTEMI) myocardial infarction: Secondary | ICD-10-CM

## 2014-11-15 DIAGNOSIS — J189 Pneumonia, unspecified organism: Secondary | ICD-10-CM
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
## 2014-11-16 ENCOUNTER — Emergency Department (HOSPITAL_COMMUNITY): Payer: Medicare Other

## 2014-11-16 ENCOUNTER — Inpatient Hospital Stay (HOSPITAL_COMMUNITY)
Admission: EM | Admit: 2014-11-16 | Discharge: 2014-12-10 | DRG: 208 | Disposition: E | Payer: Medicare Other | Attending: Pulmonary Disease | Admitting: Pulmonary Disease

## 2014-11-16 ENCOUNTER — Encounter (HOSPITAL_COMMUNITY): Payer: Self-pay

## 2014-11-16 ENCOUNTER — Inpatient Hospital Stay (HOSPITAL_COMMUNITY): Payer: Medicare Other

## 2014-11-16 DIAGNOSIS — Z931 Gastrostomy status: Secondary | ICD-10-CM

## 2014-11-16 DIAGNOSIS — E876 Hypokalemia: Secondary | ICD-10-CM | POA: Diagnosis not present

## 2014-11-16 DIAGNOSIS — R402212 Coma scale, best verbal response, none, at arrival to emergency department: Secondary | ICD-10-CM | POA: Diagnosis present

## 2014-11-16 DIAGNOSIS — Z515 Encounter for palliative care: Secondary | ICD-10-CM

## 2014-11-16 DIAGNOSIS — N390 Urinary tract infection, site not specified: Secondary | ICD-10-CM | POA: Diagnosis present

## 2014-11-16 DIAGNOSIS — R402312 Coma scale, best motor response, none, at arrival to emergency department: Secondary | ICD-10-CM | POA: Diagnosis present

## 2014-11-16 DIAGNOSIS — R64 Cachexia: Secondary | ICD-10-CM | POA: Diagnosis present

## 2014-11-16 DIAGNOSIS — E43 Unspecified severe protein-calorie malnutrition: Secondary | ICD-10-CM | POA: Diagnosis present

## 2014-11-16 DIAGNOSIS — I469 Cardiac arrest, cause unspecified: Secondary | ICD-10-CM | POA: Diagnosis present

## 2014-11-16 DIAGNOSIS — I5021 Acute systolic (congestive) heart failure: Secondary | ICD-10-CM | POA: Diagnosis present

## 2014-11-16 DIAGNOSIS — N179 Acute kidney failure, unspecified: Secondary | ICD-10-CM | POA: Diagnosis present

## 2014-11-16 DIAGNOSIS — Z7901 Long term (current) use of anticoagulants: Secondary | ICD-10-CM

## 2014-11-16 DIAGNOSIS — R1310 Dysphagia, unspecified: Secondary | ICD-10-CM | POA: Diagnosis present

## 2014-11-16 DIAGNOSIS — G931 Anoxic brain damage, not elsewhere classified: Secondary | ICD-10-CM | POA: Diagnosis present

## 2014-11-16 DIAGNOSIS — I252 Old myocardial infarction: Secondary | ICD-10-CM | POA: Diagnosis present

## 2014-11-16 DIAGNOSIS — R570 Cardiogenic shock: Secondary | ICD-10-CM | POA: Diagnosis present

## 2014-11-16 DIAGNOSIS — J9622 Acute and chronic respiratory failure with hypercapnia: Secondary | ICD-10-CM | POA: Diagnosis present

## 2014-11-16 DIAGNOSIS — J9690 Respiratory failure, unspecified, unspecified whether with hypoxia or hypercapnia: Secondary | ICD-10-CM

## 2014-11-16 DIAGNOSIS — Z79899 Other long term (current) drug therapy: Secondary | ICD-10-CM

## 2014-11-16 DIAGNOSIS — J9621 Acute and chronic respiratory failure with hypoxia: Secondary | ICD-10-CM | POA: Diagnosis present

## 2014-11-16 DIAGNOSIS — R402142 Coma scale, eyes open, spontaneous, at arrival to emergency department: Secondary | ICD-10-CM | POA: Diagnosis present

## 2014-11-16 DIAGNOSIS — B9689 Other specified bacterial agents as the cause of diseases classified elsewhere: Secondary | ICD-10-CM | POA: Diagnosis present

## 2014-11-16 DIAGNOSIS — E872 Acidosis, unspecified: Secondary | ICD-10-CM

## 2014-11-16 DIAGNOSIS — D638 Anemia in other chronic diseases classified elsewhere: Secondary | ICD-10-CM | POA: Diagnosis present

## 2014-11-16 DIAGNOSIS — T17990A Other foreign object in respiratory tract, part unspecified in causing asphyxiation, initial encounter: Principal | ICD-10-CM | POA: Diagnosis present

## 2014-11-16 DIAGNOSIS — G8250 Quadriplegia, unspecified: Secondary | ICD-10-CM | POA: Diagnosis present

## 2014-11-16 DIAGNOSIS — G253 Myoclonus: Secondary | ICD-10-CM

## 2014-11-16 DIAGNOSIS — Z792 Long term (current) use of antibiotics: Secondary | ICD-10-CM | POA: Diagnosis not present

## 2014-11-16 DIAGNOSIS — F17210 Nicotine dependence, cigarettes, uncomplicated: Secondary | ICD-10-CM | POA: Diagnosis present

## 2014-11-16 DIAGNOSIS — Z66 Do not resuscitate: Secondary | ICD-10-CM | POA: Diagnosis present

## 2014-11-16 DIAGNOSIS — I429 Cardiomyopathy, unspecified: Secondary | ICD-10-CM | POA: Diagnosis present

## 2014-11-16 DIAGNOSIS — R778 Other specified abnormalities of plasma proteins: Secondary | ICD-10-CM

## 2014-11-16 DIAGNOSIS — A419 Sepsis, unspecified organism: Secondary | ICD-10-CM | POA: Diagnosis present

## 2014-11-16 DIAGNOSIS — Z930 Tracheostomy status: Secondary | ICD-10-CM | POA: Diagnosis not present

## 2014-11-16 DIAGNOSIS — Z91041 Radiographic dye allergy status: Secondary | ICD-10-CM

## 2014-11-16 DIAGNOSIS — J9601 Acute respiratory failure with hypoxia: Secondary | ICD-10-CM

## 2014-11-16 DIAGNOSIS — B952 Enterococcus as the cause of diseases classified elsewhere: Secondary | ICD-10-CM | POA: Diagnosis present

## 2014-11-16 DIAGNOSIS — R7989 Other specified abnormal findings of blood chemistry: Secondary | ICD-10-CM

## 2014-11-16 DIAGNOSIS — Z681 Body mass index (BMI) 19 or less, adult: Secondary | ICD-10-CM | POA: Diagnosis not present

## 2014-11-16 HISTORY — DX: Accidental discharge from unspecified firearms or gun, initial encounter: W34.00XA

## 2014-11-16 HISTORY — DX: Pneumonia, unspecified organism: J18.9

## 2014-11-16 HISTORY — DX: Non-ST elevation (NSTEMI) myocardial infarction: I21.4

## 2014-11-16 LAB — URINALYSIS, ROUTINE W REFLEX MICROSCOPIC
Bilirubin Urine: NEGATIVE
GLUCOSE, UA: NEGATIVE mg/dL
KETONES UR: NEGATIVE mg/dL
Nitrite: NEGATIVE
PH: 7 (ref 5.0–8.0)
PROTEIN: 100 mg/dL — AB
Specific Gravity, Urine: 1.016 (ref 1.005–1.030)
Urobilinogen, UA: 0.2 mg/dL (ref 0.0–1.0)

## 2014-11-16 LAB — GLUCOSE, CAPILLARY
GLUCOSE-CAPILLARY: 104 mg/dL — AB (ref 70–99)
Glucose-Capillary: 94 mg/dL (ref 70–99)

## 2014-11-16 LAB — I-STAT ARTERIAL BLOOD GAS, ED
Acid-Base Excess: 6 mmol/L — ABNORMAL HIGH (ref 0.0–2.0)
Bicarbonate: 32.2 mEq/L — ABNORMAL HIGH (ref 20.0–24.0)
O2 SAT: 87 %
PCO2 ART: 50.3 mmHg — AB (ref 35.0–45.0)
PO2 ART: 49 mmHg — AB (ref 80.0–100.0)
TCO2: 34 mmol/L (ref 0–100)
pH, Arterial: 7.407 (ref 7.350–7.450)

## 2014-11-16 LAB — CBC WITH DIFFERENTIAL/PLATELET
Basophils Absolute: 0 10*3/uL (ref 0.0–0.1)
Basophils Relative: 0 % (ref 0–1)
Eosinophils Absolute: 0.1 10*3/uL (ref 0.0–0.7)
Eosinophils Relative: 0 % (ref 0–5)
HEMATOCRIT: 34.6 % — AB (ref 39.0–52.0)
Hemoglobin: 10.7 g/dL — ABNORMAL LOW (ref 13.0–17.0)
LYMPHS PCT: 4 % — AB (ref 12–46)
Lymphs Abs: 0.5 10*3/uL — ABNORMAL LOW (ref 0.7–4.0)
MCH: 30 pg (ref 26.0–34.0)
MCHC: 30.9 g/dL (ref 30.0–36.0)
MCV: 96.9 fL (ref 78.0–100.0)
MONO ABS: 0.6 10*3/uL (ref 0.1–1.0)
Monocytes Relative: 4 % (ref 3–12)
Neutro Abs: 12.7 10*3/uL — ABNORMAL HIGH (ref 1.7–7.7)
Neutrophils Relative %: 92 % — ABNORMAL HIGH (ref 43–77)
PLATELETS: 157 10*3/uL (ref 150–400)
RBC: 3.57 MIL/uL — AB (ref 4.22–5.81)
RDW: 13.8 % (ref 11.5–15.5)
WBC: 13.9 10*3/uL — ABNORMAL HIGH (ref 4.0–10.5)

## 2014-11-16 LAB — COMPREHENSIVE METABOLIC PANEL
ALBUMIN: 2.5 g/dL — AB (ref 3.5–5.2)
ALT: 50 U/L (ref 0–53)
ANION GAP: 15 (ref 5–15)
AST: 89 U/L — ABNORMAL HIGH (ref 0–37)
Alkaline Phosphatase: 180 U/L — ABNORMAL HIGH (ref 39–117)
BILIRUBIN TOTAL: 0.5 mg/dL (ref 0.3–1.2)
BUN: 22 mg/dL (ref 6–23)
CALCIUM: 8 mg/dL — AB (ref 8.4–10.5)
CO2: 25 mmol/L (ref 19–32)
Chloride: 99 mEq/L (ref 96–112)
Creatinine, Ser: 0.64 mg/dL (ref 0.50–1.35)
GFR calc non Af Amer: >90 mL/min (ref >90)
Glucose, Bld: 252 mg/dL — ABNORMAL HIGH (ref 70–99)
POTASSIUM: 4.4 mmol/L (ref 3.5–5.1)
Sodium: 139 mmol/L (ref 135–145)
TOTAL PROTEIN: 5.3 g/dL — AB (ref 6.0–8.3)

## 2014-11-16 LAB — CBC
HCT: 30.4 % — ABNORMAL LOW (ref 39.0–52.0)
Hemoglobin: 10 g/dL — ABNORMAL LOW (ref 13.0–17.0)
MCH: 31.7 pg (ref 26.0–34.0)
MCHC: 32.9 g/dL (ref 30.0–36.0)
MCV: 96.5 fL (ref 78.0–100.0)
Platelets: 137 10*3/uL — ABNORMAL LOW (ref 150–400)
RBC: 3.15 MIL/uL — ABNORMAL LOW (ref 4.22–5.81)
RDW: 13.7 % (ref 11.5–15.5)
WBC: 21.7 10*3/uL — ABNORMAL HIGH (ref 4.0–10.5)

## 2014-11-16 LAB — TROPONIN I
TROPONIN I: 0.06 ng/mL — AB (ref <0.031)
Troponin I: 0.05 ng/mL — ABNORMAL HIGH (ref <0.031)

## 2014-11-16 LAB — I-STAT CG4 LACTIC ACID, ED: Lactic Acid, Venous: 8.27 mmol/L — ABNORMAL HIGH (ref 0.5–2.2)

## 2014-11-16 LAB — URINE MICROSCOPIC-ADD ON

## 2014-11-16 LAB — PROTIME-INR
INR: 1.08 (ref 0.00–1.49)
Prothrombin Time: 14.1 seconds (ref 11.6–15.2)

## 2014-11-16 LAB — MRSA PCR SCREENING: MRSA BY PCR: NEGATIVE

## 2014-11-16 MED ORDER — LORAZEPAM 2 MG/ML IJ SOLN
2.0000 mg | Freq: Once | INTRAMUSCULAR | Status: AC
  Administered 2014-11-16: 2 mg via INTRAVENOUS

## 2014-11-16 MED ORDER — INSULIN ASPART 100 UNIT/ML ~~LOC~~ SOLN: 0.0000 [IU] | SUBCUTANEOUS | Status: DC

## 2014-11-16 MED ORDER — DEXMEDETOMIDINE BOLUS VIA INFUSION
1.0000 ug/kg | Freq: Once | INTRAVENOUS | Status: DC
  Filled 2014-11-16: qty 45

## 2014-11-16 MED ORDER — SODIUM CHLORIDE 0.9 % IV BOLUS (SEPSIS)
500.0000 mL | Freq: Once | INTRAVENOUS | Status: AC
  Administered 2014-11-16: 500 mL via INTRAVENOUS

## 2014-11-16 MED ORDER — ACETAMINOPHEN 325 MG PO TABS
650.0000 mg | ORAL_TABLET | ORAL | Status: DC | PRN
  Administered 2014-11-18: 650 mg
  Filled 2014-11-16: qty 2

## 2014-11-16 MED ORDER — LORAZEPAM 2 MG/ML IJ SOLN
2.0000 mg | Freq: Four times a day (QID) | INTRAMUSCULAR | Status: DC | PRN
  Administered 2014-11-17 – 2014-11-18 (×3): 2 mg via INTRAVENOUS
  Filled 2014-11-16 (×4): qty 1

## 2014-11-16 MED ORDER — IPRATROPIUM-ALBUTEROL 0.5-2.5 (3) MG/3ML IN SOLN
3.0000 mL | Freq: Four times a day (QID) | RESPIRATORY_TRACT | Status: DC
  Administered 2014-11-16 – 2014-11-18 (×5): 3 mL via RESPIRATORY_TRACT
  Filled 2014-11-16 (×5): qty 3

## 2014-11-16 MED ORDER — LEVETIRACETAM IN NACL 1000 MG/100ML IV SOLN
1000.0000 mg | Freq: Once | INTRAVENOUS | Status: AC
  Administered 2014-11-16: 1000 mg via INTRAVENOUS
  Filled 2014-11-16: qty 100

## 2014-11-16 MED ORDER — PANTOPRAZOLE SODIUM 40 MG IV SOLR
40.0000 mg | Freq: Every day | INTRAVENOUS | Status: DC
  Administered 2014-11-16: 40 mg via INTRAVENOUS
  Filled 2014-11-16 (×2): qty 40

## 2014-11-16 MED ORDER — LEVETIRACETAM IN NACL 500 MG/100ML IV SOLN
500.0000 mg | Freq: Two times a day (BID) | INTRAVENOUS | Status: DC
  Administered 2014-11-16: 500 mg via INTRAVENOUS
  Filled 2014-11-16 (×2): qty 100

## 2014-11-16 MED ORDER — CHLORHEXIDINE GLUCONATE 0.12 % MT SOLN
15.0000 mL | Freq: Two times a day (BID) | OROMUCOSAL | Status: DC
  Administered 2014-11-16 – 2014-11-18 (×3): 15 mL via OROMUCOSAL
  Filled 2014-11-16 (×3): qty 15

## 2014-11-16 MED ORDER — PROPOFOL 10 MG/ML IV EMUL
INTRAVENOUS | Status: AC
  Filled 2014-11-16: qty 100

## 2014-11-16 MED ORDER — SODIUM CHLORIDE 0.9 % IV SOLN: 250.0000 mL | INTRAVENOUS | Status: DC | PRN

## 2014-11-16 MED ORDER — CETYLPYRIDINIUM CHLORIDE 0.05 % MT LIQD
7.0000 mL | Freq: Four times a day (QID) | OROMUCOSAL | Status: DC
  Administered 2014-11-16 – 2014-11-17 (×2): 7 mL via OROMUCOSAL

## 2014-11-16 MED ORDER — LORAZEPAM 2 MG/ML IJ SOLN
INTRAMUSCULAR | Status: AC
  Filled 2014-11-16: qty 1

## 2014-11-16 MED ORDER — HEPARIN SODIUM (PORCINE) 5000 UNIT/ML IJ SOLN
5000.0000 [IU] | Freq: Three times a day (TID) | INTRAMUSCULAR | Status: DC
  Administered 2014-11-16 – 2014-11-17 (×2): 5000 [IU] via SUBCUTANEOUS
  Filled 2014-11-16 (×5): qty 1

## 2014-11-16 MED ORDER — PROPOFOL 10 MG/ML IV EMUL
5.0000 ug/kg/min | INTRAVENOUS | Status: DC
  Administered 2014-11-16: 5 ug/kg/min via INTRAVENOUS
  Filled 2014-11-16: qty 100

## 2014-11-16 MED ORDER — DEXTROSE-NACL 5-0.45 % IV SOLN
INTRAVENOUS | Status: DC
  Administered 2014-11-16 – 2014-11-17 (×2): via INTRAVENOUS

## 2014-11-16 MED ORDER — LEVETIRACETAM IN NACL 500 MG/100ML IV SOLN
500.0000 mg | Freq: Once | INTRAVENOUS | Status: DC
Start: 2014-11-16 — End: 2014-11-19
  Filled 2014-11-16: qty 100

## 2014-11-16 NOTE — Progress Notes (Addendum)
RN calling elink  Ongoing myoclonus despite keppra and ativan prn  Rx Diprivan gtt flexiseal Air overlay matterss  Dr. Kalman ShanMurali Yasmeen Manka, M.D., [facility].C.C.P Pulmonary and Critical Care Medicine Staff Physician [facility] Pager: [phone], If no answer or between  15:00h - 7:00h: call [phone]  11/17/2014 8:16 PM
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
## 2014-11-16 NOTE — Progress Notes (Signed)
   12-11-2014 1517  Clinical Encounter Type  Visited With Family;Patient and family together;Health care provider  Visit Type Initial;Critical Care;ED  Stress Factors  Family Stress Factors Health changes;Lack of knowledge  Advance Directives (For Healthcare)  Does patient have an advance directive? No  Would patient like information on creating an advanced directive? No - patient declined information   Chaplain was paged at 1:51 PM and notified that the patient's family was beginning to arrive to the hospital. Chaplain escorted patient's family to consultation B. Medical team was still working with the patient. Chaplain was present while physician consulted the patient's family.  Patient's mother has been really upset but her other children are really supportive. Chaplain provided patient's family with updates as to when they could visit with the patient and provided the patient's family with beverages as needed. Patient's mother and patient's sister are at bedside currently. Chaplain will continue to provide emotional and spiritual support for patient and patient's family as needed. Cranston NeighborStrother, Mariluz Crespo R, Chaplain  3:34 PM

## 2014-11-16 NOTE — Progress Notes (Signed)
STAT EEG completed; results pending. 

## 2014-11-16 NOTE — ED Notes (Signed)
Family in triage.   Chaplain has been called to come speak with family.

## 2014-11-16 NOTE — ED Notes (Signed)
Dr. Darrol AngelSimons was asked if pt is [facility], he confirmed that the pt is not an [facility] candidate.

## 2014-11-16 NOTE — ED Notes (Signed)
Dr. Sumner at the bedside.  

## 2014-11-16 NOTE — ED Notes (Signed)
Family at bedside. 

## 2014-11-16 NOTE — ED Notes (Signed)
[facility]. Upon EMS arrival staff was doing compressions on pt and he was PEA on monitor. Given 17 min worth of compressions, 2 mg Narcan, 3 Epi's and 1 liter cold saline. Hx of STEMI and quadraplegic. IO to right tibia.
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
## 2014-11-16 NOTE — ED Notes (Signed)
i-stat CG4 Lactic acid result shown to Dr. James 

## 2014-11-16 NOTE — ED Notes (Addendum)
Dr. Sung AmabileSimonds at the bedside to speak with family

## 2014-11-16 NOTE — ED Notes (Signed)
EEG being completed at this time 

## 2014-11-16 NOTE — ED Provider Notes (Signed)
CSN: [id number]     Arrival date & time 22-Nov-2014  1426 History   First MD Initiated Contact with Patient 11-22-2014 1430     Chief Complaint  Patient presents with  . Cardiac Arrest    Post     (Consider location/radiation/quality/duration/timing/severity/associated sxs/prior Treatment) HPI Comments:  49 y.o. male presents after cardiac arrest. EMS reports that the patient was found down after an unknown period of time with agonal respirations. No pulse was noted and CPR was started at the scene by the nurses at [facility]. When EMS got there the patient was found to be in PEA. He got 3 rounds of epinephrine and 2 rounds of Narcan. He received CPR for approximately 17 minutes before ROSC. Level 5 caveat due to altered mental status.   The history is provided by the EMS personnel.    Past Medical History  Diagnosis Date  . Quadriplegia   . Anxiety   . Tobacco dependence   . Chronic neck pain   . Chronic shoulder pain   . Spinal cord injury, C5-C7    Past Surgical History  Procedure Laterality Date  . Tracheostomy      feinstein   No family history on file. History  Substance Use Topics  . Smoking status: Current Every Day Smoker  . Smokeless tobacco: Not on file  . Alcohol Use: Not on file    Review of Systems  Unable to perform ROS: Acuity of condition      Allergies  Contrast media  Home Medications   Prior to Admission medications   Medication Sig Start Date End Date Taking? Authorizing Provider  acetaminophen (TYLENOL) 160 MG/5ML solution Take 20.3 mLs (650 mg total) by mouth every 6 (six) hours as needed for mild pain or fever. 10/24/14   Simonne Martinet, NP  albuterol (PROVENTIL) (2.5 MG/3ML) 0.083% nebulizer solution Take 3 mLs (2.5 mg total) by nebulization every 3 (three) hours as needed for wheezing or shortness of breath. 10/24/14   Simonne Martinet, NP  antiseptic oral rinse (CPC / CETYLPYRIDINIUM CHLORIDE 0.05%) 0.05 % LIQD solution 7 mLs by Mouth Rinse  route QID. 10/24/14   Simonne Martinet, NP  budesonide (PULMICORT) 0.5 MG/2ML nebulizer solution Take 2 mLs (0.5 mg total) by nebulization 2 (two) times daily. 10/24/14   Simonne Martinet, NP  chlorhexidine (PERIDEX) 0.12 % solution 15 mLs by Mouth Rinse route 2 (two) times daily. 10/24/14   Simonne Martinet, NP  clonazePAM (KLONOPIN) 1 MG tablet Place 1 tablet (1 mg total) into feeding tube 3 (three) times daily at 8am, 2pm and bedtime. 10/24/14   Simonne Martinet, NP  doxazosin (CARDURA) 1 MG tablet Place 1 tablet (1 mg total) into feeding tube daily. 10/24/14   Simonne Martinet, NP  fentaNYL (SUBLIMAZE) 0.05 MG/ML injection Inject 0.5-2 mLs (25-100 mcg total) into the vein every 2 (two) hours as needed for severe pain. 10/24/14   Simonne Martinet, NP  guaiFENesin (ROBITUSSIN) 100 MG/5ML SOLN Take 10 mLs (200 mg total) by mouth every 12 (twelve) hours. 10/24/14   Simonne Martinet, NP  heparin 5000 UNIT/ML injection Inject 1 mL (5,000 Units total) into the skin every 8 (eight) hours. 10/24/14   Simonne Martinet, NP  ipratropium (ATROVENT) 0.02 % nebulizer solution Take 2.5 mLs (0.5 mg total) by nebulization every 6 (six) hours. 10/24/14   Simonne Martinet, NP  levalbuterol Pauline Aus) 0.63 MG/3ML nebulizer solution Take 3 mLs (0.63 mg total) by nebulization every  6 (six) hours. 10/24/14   Simonne MartinetPeter E Babcock, NP  loperamide (IMODIUM) 1 MG/5ML solution Place 10 mLs (2 mg total) into feeding tube as needed for diarrhea or loose stools. 10/24/14   Simonne MartinetPeter E Babcock, NP  metoprolol tartrate (LOPRESSOR) 25 mg/10 mL SUSP Place 10 mLs (25 mg total) into feeding tube every 6 (six) hours. 10/24/14   Simonne MartinetPeter E Babcock, NP  Multiple Vitamin (MULTIVITAMIN WITH MINERALS) TABS tablet Take 1 tablet by mouth daily.    Historical Provider, MD  Nutritional Supplements (FEEDING SUPPLEMENT, VITAL AF 1.2 CAL,) LIQD Place 1,000 mLs into feeding tube continuous. 10/24/14   Simonne MartinetPeter E Babcock, NP  piperacillin-tazobactam (ZOSYN) 3.375 GM/50ML  IVPB Inject 50 mLs (3.375 g total) into the vein every 8 (eight) hours. 10/24/14   Simonne MartinetPeter E Babcock, NP  potassium chloride 20 MEQ/15ML (10%) SOLN Place 30 mLs (40 mEq total) into feeding tube every 4 (four) hours. 10/24/14   Simonne MartinetPeter E Babcock, NP  ranitidine (ZANTAC) 150 MG/10ML syrup Take 10 mLs (150 mg total) by mouth at bedtime. 10/24/14   Simonne MartinetPeter E Babcock, NP  saccharomyces boulardii (FLORASTOR) 250 MG capsule Take 1 capsule (250 mg total) by mouth 2 (two) times daily. 10/24/14   Simonne MartinetPeter E Babcock, NP  sodium chloride 0.9 % infusion KVO 10/24/14   Simonne MartinetPeter E Babcock, NP  Water For Irrigation, Sterile (FREE WATER) SOLN Place 200 mLs into feeding tube every 4 (four) hours. 10/24/14   Simonne MartinetPeter E Babcock, NP   BP 178/138 mmHg  Pulse 84  Temp(Src) 95.7 F (35.4 C) (Temporal)  Resp 18  Wt 98 lb (44.453 kg)  SpO2 100% Physical Exam  Constitutional:  Thin  HENT:  Head: Normocephalic and atraumatic.  Right Ear: External ear normal.  Left Ear: External ear normal.  Nose: Nose normal.  Mouth/Throat: Oropharynx is clear and moist. No oropharyngeal exudate.  Eyes:  Pupils are 2-3 mm bilaterally and minimally reactive.  Neck: Normal range of motion. Neck supple.  Cardiovascular: Normal rate, regular rhythm, normal heart sounds and intact distal pulses.  Exam reveals no gallop and no friction rub.   No murmur heard. Pulmonary/Chest: He has no wheezes.  rhonchorus lung sounds bilaterally.  Abdominal: Soft. Bowel sounds are normal. He exhibits no distension. There is no tenderness. There is no rebound and no guarding.  Musculoskeletal: Normal range of motion. He exhibits no edema or tenderness.  Multiple sacral ulcers noted. The largest is about 10 x 10 cm over the sacrum.  Neurological: GCS eye subscore is 4. GCS verbal subscore is 1. GCS motor subscore is 1.  Skin: Skin is warm and dry.  Nursing note and vitals reviewed.   ED Course  Procedures (including critical care time) Labs Review Labs  Reviewed  CBC WITH DIFFERENTIAL - Abnormal; Notable for the following:    WBC 13.9 (*)    RBC 3.57 (*)    Hemoglobin 10.7 (*)    HCT 34.6 (*)    Neutrophils Relative % 92 (*)    Neutro Abs 12.7 (*)    Lymphocytes Relative 4 (*)    Lymphs Abs 0.5 (*)    All other components within normal limits  COMPREHENSIVE METABOLIC PANEL - Abnormal; Notable for the following:    Glucose, Bld 252 (*)    Calcium 8.0 (*)    Total Protein 5.3 (*)    Albumin 2.5 (*)    AST 89 (*)    Alkaline Phosphatase 180 (*)    All other components within normal limits  TROPONIN I - Abnormal; Notable for the following:    Troponin I 0.05 (*)    All other components within normal limits  URINALYSIS, ROUTINE W REFLEX MICROSCOPIC - Abnormal; Notable for the following:    APPearance CLOUDY (*)    Hgb urine dipstick TRACE (*)    Protein, ur 100 (*)    Leukocytes, UA MODERATE (*)    All other components within normal limits  URINE MICROSCOPIC-ADD ON - Abnormal; Notable for the following:    Bacteria, UA FEW (*)    Casts HYALINE CASTS (*)    All other components within normal limits  I-STAT CG4 LACTIC ACID, ED - Abnormal; Notable for the following:    Lactic Acid, Venous 8.27 (*)    All other components within normal limits  CULTURE, BLOOD (ROUTINE X 2)  CULTURE, BLOOD (ROUTINE X 2)  CULTURE, RESPIRATORY (NON-EXPECTORATED)  URINE CULTURE  PROTIME-INR  CBC  TROPONIN I  TROPONIN I  TROPONIN I  BLOOD GAS, ARTERIAL  CBC  BASIC METABOLIC PANEL  MAGNESIUM  PHOSPHORUS    Imaging Review Dg Chest Port 1 View  11-23-14   CLINICAL DATA:  49 year old male with cardiac arrest. Initial encounter.  EXAM: PORTABLE CHEST - 1 VIEW  COMPARISON:  11/05/2014 and earlier.  FINDINGS: Portable AP supine view at 1503 hrs. Suboptimal radiographic technique, such that the right lung is over exposed. Resuscitation pads over the left chest. Stable tracheostomy tube. Enteric tube has been removed. Stable cardiac size and  mediastinal contours. No pneumothorax, pleural effusion, or acute pulmonary opacity identified. No displaced rib fracture identified.  IMPRESSION: Over-exposure of the right lung on this film, no acute cardiopulmonary abnormality identified.   Electronically Signed   By: Augusto Gamble M.D.   On: 23-Nov-2014 15:18     EKG Interpretation   Date/Time:  Friday Nov 23, 2014 14:30:07 EST Ventricular Rate:  80 PR Interval:  187 QRS Duration: 92 QT Interval:  389 QTC Calculation: 449 R Axis:   93 Text Interpretation:  Sinus rhythm Borderline right axis deviation  Abnormal R-wave progression, late transition ST elev, probable normal  early repol pattern Artifact in lead(s) I II III aVR aVL aVF V1 V2 No  significant change since last tracing Confirmed by Blaze Nylund  MD, Lori Liew  ([id number]) on 2014/11/23 2:32:33 PM     CRITICAL CARE Performed by: Purvis Sheffield, S Total critical care time: 30 min Critical care time was exclusive of separately billable procedures and treating other patients. Critical care was necessary to treat or prevent imminent or life-threatening deterioration. Critical care was time spent personally by me on the following activities: development of treatment plan with patient and/or surrogate as well as nursing, discussions with consultants, evaluation of patient's response to treatment, examination of patient, obtaining history from patient or surrogate, ordering and performing treatments and interventions, ordering and review of laboratory studies, ordering and review of radiographic studies, pulse oximetry and re-evaluation of patient's condition.  MDM   Final diagnoses:  Cardiac arrest  Acute respiratory failure with hypoxia  Elevated troponin  Lactic acidosis    2:39 PM 49 y.o. male presents after cardiac arrest. EMS reports that the patient was found down after an unknown period of time with agonal respirations. No pulse was noted and CPR was started at the scene by the  nurses at [facility]. When EMS got there the patient was found to be in PEA. He got 3 rounds of epinephrine and 2 rounds of Narcan. He received CPR for approximately  17 minutes before ROSC. VSS on arrival. Pt being ventilated through his trach. No evidence of spontaneous movements on my exam. Pupils are 2-3 mm bilaterally with minimal response. EKG is noncontributory. Will discuss case with cardiology and critical care.  Discussed w/ cards, pt not a candidate for cath. Consulted CC for evaluation.     Purvis Sheffield, MD 11/24/2014 [phone]

## 2014-11-16 NOTE — Procedures (Cosign Needed)
ELECTROENCEPHALOGRAM REPORT   Patient: Brandon SchwalbeRobert L Gaglio       Room #: [id number] EEG No. ID: [id number] Age: 49 y.o.        Sex: male Referring Physician: Sung AmabileSimonds Report Date:  11/27/2014        Interpreting Physician: Thana FarrEYNOLDS, Brigid Vandekamp  History: Brandon Montgomery is an 49 y.o. male s/p arrest with myoclonus  Medications:  Scheduled: . heparin  5,000 Units Subcutaneous 3 times per day  . insulin aspart  0-9 Units Subcutaneous 6 times per day  . ipratropium-albuterol  3 mL Nebulization Q6H  . levETIRAcetam  500 mg Intravenous Q12H  . levETIRAcetam  500 mg Intravenous Once  . LORazepam      . pantoprazole (PROTONIX) IV  40 mg Intravenous QHS  . propofol        Conditions of Recording:  This is a 16 channel EEG carried out with the patient in the intubated and sedated state.  Description:  The background activity is marred by muscle and movement artifact.  No background activity can be evaluated throughout most of the recording.  The patient has frequent episodes of myoclonus during the recording.  Movement artifact is associated with these episodes of myoclonus and due to the artifact noted during the tracing it is difficult to discern more information concerning these episodes.  During the end of the recording there appears to be periods of attenuation that follow each episode of myoclonus and associated artifact.  These periods of attenuation are short-lived and generalized.  Hyperventilation and intermittent photic stimulation were not performed.   IMPRESSION: This is a technically difficult electroencephalogram due to the predominance of muscle and movement artifact.  This artifact is accentuated by the frequent episodes of myoclonus captured during the recording.  Due to the artifact it is difficult to determine if this myoclonus is epileptic in etiology but due to the attenuation noted during the end of the tracing this can not be excluded.     Thana FarrLeslie Jillann Charette, MD [facility] [phone] 11/25/2014, 8:38 PM

## 2014-11-17 ENCOUNTER — Inpatient Hospital Stay (HOSPITAL_COMMUNITY): Payer: Medicare Other

## 2014-11-17 DIAGNOSIS — I469 Cardiac arrest, cause unspecified: Secondary | ICD-10-CM

## 2014-11-17 LAB — BASIC METABOLIC PANEL
Anion gap: 8 (ref 5–15)
BUN: 20 mg/dL (ref 6–23)
CO2: 28 mmol/L (ref 19–32)
Calcium: 8.2 mg/dL — ABNORMAL LOW (ref 8.4–10.5)
Chloride: 104 mEq/L (ref 96–112)
Creatinine, Ser: 0.47 mg/dL — ABNORMAL LOW (ref 0.50–1.35)
GFR calc non Af Amer: >90 mL/min (ref >90)
GLUCOSE: 106 mg/dL — AB (ref 70–99)
POTASSIUM: 3.5 mmol/L (ref 3.5–5.1)
Sodium: 140 mmol/L (ref 135–145)

## 2014-11-17 LAB — PHOSPHORUS: Phosphorus: 3.3 mg/dL (ref 2.3–4.6)

## 2014-11-17 LAB — CBC
HCT: 28.8 % — ABNORMAL LOW (ref 39.0–52.0)
Hemoglobin: 9.3 g/dL — ABNORMAL LOW (ref 13.0–17.0)
MCH: 30.4 pg (ref 26.0–34.0)
MCHC: 32.3 g/dL (ref 30.0–36.0)
MCV: 94.1 fL (ref 78.0–100.0)
Platelets: 147 10*3/uL — ABNORMAL LOW (ref 150–400)
RBC: 3.06 MIL/uL — ABNORMAL LOW (ref 4.22–5.81)
RDW: 14.1 % (ref 11.5–15.5)
WBC: 14.9 10*3/uL — AB (ref 4.0–10.5)

## 2014-11-17 LAB — MAGNESIUM: MAGNESIUM: 1.9 mg/dL (ref 1.5–2.5)

## 2014-11-17 LAB — GLUCOSE, CAPILLARY
Glucose-Capillary: 91 mg/dL (ref 70–99)
Glucose-Capillary: 102 mg/dL — ABNORMAL HIGH (ref 70–99)
Glucose-Capillary: 119 mg/dL — ABNORMAL HIGH (ref 70–99)

## 2014-11-17 LAB — TROPONIN I
Troponin I: 0.06 ng/mL — ABNORMAL HIGH (ref <0.031)
Troponin I: 0.07 ng/mL — ABNORMAL HIGH (ref <0.031)

## 2014-11-17 MED ORDER — LEVETIRACETAM IN NACL 1500 MG/100ML IV SOLN
1500.0000 mg | Freq: Two times a day (BID) | INTRAVENOUS | Status: DC
  Administered 2014-11-17 – 2014-11-19 (×4): 1500 mg via INTRAVENOUS
  Filled 2014-11-17 (×7): qty 100

## 2014-11-17 MED ORDER — MORPHINE SULFATE 25 MG/ML IV SOLN
10.0000 mg/h | INTRAVENOUS | Status: DC
  Administered 2014-11-17: 5 mg/h via INTRAVENOUS
  Administered 2014-11-18: 10 mg/h via INTRAVENOUS
  Administered 2014-11-18 – 2014-11-19 (×2): 25 mg/h via INTRAVENOUS
  Filled 2014-11-17 (×5): qty 10

## 2014-11-17 MED ORDER — VALPROATE SODIUM 500 MG/5ML IV SOLN
170.0000 mg | Freq: Four times a day (QID) | INTRAVENOUS | Status: DC
  Administered 2014-11-17 – 2014-11-19 (×8): 170 mg via INTRAVENOUS
  Filled 2014-11-17 (×11): qty 1.7

## 2014-11-17 MED ORDER — POTASSIUM CHLORIDE 20 MEQ/15ML (10%) PO SOLN
40.0000 meq | Freq: Once | ORAL | Status: AC
  Administered 2014-11-17: 40 meq
  Filled 2014-11-17: qty 30

## 2014-11-17 MED ORDER — LEVETIRACETAM IN NACL 500 MG/100ML IV SOLN
500.0000 mg | Freq: Once | INTRAVENOUS | Status: AC
  Administered 2014-11-17: 500 mg via INTRAVENOUS
  Filled 2014-11-17: qty 100

## 2014-11-17 MED ORDER — VALPROATE SODIUM 500 MG/5ML IV SOLN: 15.0000 mg/kg/d | Freq: Four times a day (QID) | INTRAVENOUS | Status: DC

## 2014-11-17 MED ORDER — MAGNESIUM SULFATE 2 GM/50ML IV SOLN
2.0000 g | Freq: Once | INTRAVENOUS | Status: AC
  Administered 2014-11-17: 2 g via INTRAVENOUS
  Filled 2014-11-17: qty 50

## 2014-11-17 MED ORDER — MORPHINE BOLUS VIA INFUSION
5.0000 mg | INTRAVENOUS | Status: DC | PRN
  Administered 2014-11-17 – 2014-11-18 (×2): 10 mg via INTRAVENOUS
  Filled 2014-11-17 (×3): qty 20

## 2014-11-17 NOTE — Progress Notes (Signed)
Ventilator removed per family request; pt with ozxygen per protocol; VS per flowsheet; morphine gtt infusing; all family at bedside; questions encouraged and answered; pt family declined chaplain at this time; will continue to closely monitor

## 2014-11-17 NOTE — Progress Notes (Signed)
  Echocardiogram 2D Echocardiogram has been performed.  Brandon Montgomery, Brandon Montgomery 11/17/2014, 8:54 AM

## 2014-11-17 NOTE — Progress Notes (Signed)
[facility] FOR [facility] LAB REPLACEMENT ONLY  The patient does not apply for the [facility] Adult [facility] Electrolyte Replacment Protocol based on the criteria listed below:   1. Is GFR >/= 40 ml/min? Yes.    Patient's GFR today is >90 2. Is urine output >/= 0.5 ml/kg/hr for the last 6 hours? No. Patient's UOP is NONE RECORDED  ml/kg/hr 3. Is BUN < 60 mg/dL? Yes.    Patient's BUN today is 20 4. Abnormal electrolyte(s): K+3.5 5. Ordered repletion with: NA 6. If a panic level lab has been reported, has the CCM MD in charge been notified? Yes.  .   Physician:  E Deterding  Cathlean CowerBradshaw, Kardell Virgil [facility] 11/17/2014 5:41 AM:

## 2014-11-17 NOTE — Progress Notes (Signed)
Subjective: No overnight events. Continued myoclonic activity noted. Keppra was increased to 1500mg  BID  EEG limited by predominance of muscle/movement artifact. Movements appear non-ictal but cannot rule out epileptic etiology of myoclonus.   Objective: Current vital signs: BP 143/86 mmHg  Pulse 97  Temp(Src) 98.1 F (36.7 C) (Core (Comment))  Resp 33  Ht 5\' 7"  (1.702 m)  Wt 44.6 kg (98 lb 5.2 oz)  BMI 15.40 kg/m2  SpO2 100% Vital signs in last 24 hours: Temp:  [95.7 F (35.4 C)-98.6 F (37 C)] 98.1 F (36.7 C) (01/09 0600) Pulse Rate:  [30-130] 97 (01/09 0600) Resp:  [12-33] 33 (01/09 0600) BP: (114-183)/(60-138) 143/86 mmHg (01/09 0600) SpO2:  [97 %-100 %] 100 % (01/09 0600) FiO2 (%):  [50 %-100 %] 50 % (01/09 0400) Weight:  [44.453 kg (98 lb)-44.6 kg (98 lb 5.2 oz)] 44.6 kg (98 lb 5.2 oz) (01/08 1900)  Intake/Output from previous day: 01/08 0701 - 01/09 0700 In: 524.7 [I.V.:524.7] Out: 265 [Urine:265] Intake/Output this shift:   Nutritional status:    Neurologic Exam: Mental Status: Patient does not respond to verbal stimuli. Does not respond to deep sternal rub. Does not follow commands. No verbalizations noted. Has trach collar and breathing with vent Cranial Nerves: II: patient does not respond confrontation bilaterally, pupils right 2 mm, left 2 mm,and reactive bilaterally III,IV,VI: doll's response present bilaterally.  V,VII: corneal reflex absent bilaterally --though difficult to assess due to sever myoclonus VIII: patient does not respond to verbal stimuli IX,X: gag reflex unable to assess,  Motor: Extremities flaccid throughout LE (known quadriplegic) bilateral UE showing significant myoclonus that was aggerated with stimulation. No spontaneous movement noted. No purposeful movements noted. Sensory: Does not respond to noxious stimuli in any extremity. Deep Tendon Reflexes:  Absent throughout. Plantars: absent bilaterally   Lab  Results: Basic Metabolic Panel:  Recent Labs Lab 11/10/14 0938 11/10/14 1840 2015-01-03 1505 11/17/14 0255  NA  --   --  139 140  K 5.8* 4.6 4.4 3.5  CL  --   --  99 104  CO2  --   --  25 28  GLUCOSE  --   --  252* 106*  BUN  --   --  22 20  CREATININE  --   --  0.64 0.47*  CALCIUM  --   --  8.0* 8.2*  MG  --   --   --  1.9  PHOS  --   --   --  3.3    Liver Function Tests:  Recent Labs Lab 2015-01-03 1505  AST 89*  ALT 50  ALKPHOS 180*  BILITOT 0.5  PROT 5.3*  ALBUMIN 2.5*   No results for input(s): LIPASE, AMYLASE in the last 168 hours. No results for input(s): AMMONIA in the last 168 hours.  CBC:  Recent Labs Lab 2015-01-03 1505 2015-01-03 1741 11/17/14 0255  WBC 13.9* 21.7* 14.9*  NEUTROABS 12.7*  --   --   HGB 10.7* 10.0* 9.3*  HCT 34.6* 30.4* 28.8*  MCV 96.9 96.5 94.1  PLT 157 137* 147*    Cardiac Enzymes:  Recent Labs Lab 2015-01-03 1505 2015-01-03 1741 2015-01-03 2350 11/17/14 0255  TROPONINI 0.05* 0.06* 0.07* 0.06*    Lipid Panel: No results for input(s): CHOL, TRIG, HDL, CHOLHDL, VLDL, LDLCALC in the last 168 hours.  CBG:  Recent Labs Lab 2015-01-03 2000 2015-01-03 2321 11/17/14 0323  GLUCAP 94 104* 91    Microbiology: Results for orders placed or performed during the  hospital encounter of 12/05/2014  MRSA PCR Screening     Status: None   Collection Time: 12-05-2014  6:54 PM  Result Value Ref Range Status   MRSA by PCR NEGATIVE NEGATIVE Final    Comment:        The GeneXpert MRSA Assay (FDA approved for NASAL specimens only), is one component of a comprehensive MRSA colonization surveillance program. It is not intended to diagnose MRSA infection nor to guide or monitor treatment for MRSA infections.     Coagulation Studies:  Recent Labs  05-Dec-2014 1505  LABPROT 14.1  INR 1.08    Imaging: Ct Head Wo Contrast  Dec 05, 2014   CLINICAL DATA:  Found down, cardiac arrest  EXAM: CT HEAD WITHOUT CONTRAST  TECHNIQUE: Contiguous axial  images were obtained from the base of the skull through the vertex without intravenous contrast.  COMPARISON:  None.  FINDINGS: Motion degraded images.  No evidence of parenchymal hemorrhage or extra-axial fluid collection. No mass lesion, mass effect, or midline shift.  No CT evidence of acute infarction.  Cerebral volume is within normal limits.  No ventriculomegaly.  The visualized paranasal sinuses are essentially clear. The mastoid air cells are unopacified.  No evidence of calvarial fracture.  IMPRESSION: Motion degraded images.  Normal head CT.   Electronically Signed   By: Charline Bills M.D.   On: 12-05-2014 18:49   Dg Chest Port 1 View  12/05/14   CLINICAL DATA:  49 year old male with cardiac arrest. Initial encounter.  EXAM: PORTABLE CHEST - 1 VIEW  COMPARISON:  11/05/2014 and earlier.  FINDINGS: Portable AP supine view at 1503 hrs. Suboptimal radiographic technique, such that the right lung is over exposed. Resuscitation pads over the left chest. Stable tracheostomy tube. Enteric tube has been removed. Stable cardiac size and mediastinal contours. No pneumothorax, pleural effusion, or acute pulmonary opacity identified. No displaced rib fracture identified.  IMPRESSION: Over-exposure of the right lung on this film, no acute cardiopulmonary abnormality identified.   Electronically Signed   By: Augusto Gamble M.D.   On: 12-05-2014 15:18    Medications:  Scheduled: . antiseptic oral rinse  7 mL Mouth Rinse QID  . chlorhexidine  15 mL Mouth Rinse BID  . heparin  5,000 Units Subcutaneous 3 times per day  . insulin aspart  0-9 Units Subcutaneous 6 times per day  . ipratropium-albuterol  3 mL Nebulization Q6H  . levETIRAcetam  1,500 mg Intravenous Q12H  . levETIRAcetam  500 mg Intravenous Once  . pantoprazole (PROTONIX) IV  40 mg Intravenous QHS  . propofol        Assessment/Plan:  49 YO male found unresponsive for unknown time with 17+ minutes of CPR then found to be in PEA by EMS. ROSC  obtained, and patient brought to [facility] ED. 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   Recommend: 1) continue keppra  BID 2)Can add Depakote  IV BID if myoclonus continues with higher dose of keppra 3)Would consider MRI brain to better determine extent of anoxic injury. Can use short acting paralytic such as Nimbex in order to facilitate MRI 4) Will continue to follow   LOS: 1 day   Elspeth Cho, DO [facility] [phone]  If 7pm- 7am, please page neurology on call as listed in AMION. 11/17/2014  7:35 AM

## 2014-11-17 NOTE — Progress Notes (Signed)
eLink Physician-Brief Progress Note Patient Name: Brandon SchwalbeRobert L Montgomery DOB: 03/26/1966 MRN: [id number]   Date of Service  11/17/2014  HPI/Events of Note  Hypokalemia and mag of 1.9  eICU Interventions  Potassium and mag replaced     Intervention Category Intermediate Interventions: Electrolyte abnormality - evaluation and management  Mariadejesus Cade 11/17/2014, 5:46 AM

## 2014-11-17 NOTE — Progress Notes (Signed)
RT removed vent per MD order with withdrawal of care. Trach collar of 28 % placed on patient. Patient resting comfortably.

## 2014-11-17 NOTE — Progress Notes (Signed)
[facility] Had long discussion w/ Brandon Montgomery's Mother, sisters and brothers, plus extended family. They understand that this has been a catastrophic injury. They do not wish to continue aggressive care and support w/drawl w/ focus on comfort.   Plan DC vent Cont ATC D/c diprivan  Start morphine gtt Add depakote for myoclonus  D/c all care that does not contribute to comfort.   Simonne MartinetPeter E Nichlas Pitera ACNP-BC [facility]Critical [facility] Pager # [phone] OR # [phone] if no answer

## 2014-11-17 NOTE — Progress Notes (Signed)
Family conference with Anders SimmondsPete Babcock regarding withdrawal of care; questions encouraged and answered; family agrees to withdraw care; awaiting morphine drip and anti seizure medicine; multiple family at bedside; will continue to closely monitor
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
## 2014-11-17 NOTE — Progress Notes (Signed)
PULMONARY / CRITICAL CARE MEDICINE   Name: Brandon Montgomery MRN: [id number] DOB: 24-Sep-1966    ADMISSION DATE:  11/14/2014 CONSULTATION DATE:  11/29/2014  REFERRING MD :  EDP  CHIEF COMPLAINT:  PEA arrest.   INITIAL PRESENTATION:  38 M quad with recent hospitalization under PCCM care for resp failure which culminated in trach tube placement 12/16 and transfer to [facility] 12/17. He was discharged to a SNF a couple of days prior to this admission. Complained of increasing congestion on evening prior to and morning of admission. Found unresponsive and pulseless on day of admission. CPR/ACLS initiated with 17 mins until ROSC. Duration of pulselessness prior to discovery is unknown. In ED, exhibited severe myoclonus.   MAJOR EVENTS/TEST RESULTS:  01/08 Admitted from SNF post PEA arrest with severe myoclonus 01/08 CT head: NAD 01/08 EEG: This is a technically difficult electroencephalogram due to the predominance of muscle and movement artifact. This artifact is accentuated by the frequent episodes of myoclonus captured during the recording. Due to the artifact it is difficult to determine if this myoclonus is epileptic in etiology but due to the attenuation noted during the end of the tracing this can not be excluded. 01/09 TTE:   INDWELLING DEVICES:: Trach tube (chronic) G tube (chronic)   SUBJECTIVE:   VITAL SIGNS: Temp:  [96.4 F (35.8 C)-98.6 F (37 C)] 97.9 F (36.6 C) (01/09 1220) Pulse Rate:  [30-130] 91 (01/09 1220) Resp:  [12-33] 18 (01/09 1220) BP: (114-183)/(60-108) 147/85 mmHg (01/09 1220) SpO2:  [97 %-100 %] 100 % (01/09 1220) FiO2 (%):  [40 %-100 %] 40 % (01/09 1220) Weight:  [44.6 kg (98 lb 5.2 oz)] 44.6 kg (98 lb 5.2 oz) (01/08 1900) HEMODYNAMICS:   VENTILATOR SETTINGS: Vent Mode:  [-] PRVC FiO2 (%):  [40 %-100 %] 40 % Set Rate:  [18 bmp] 18 bmp Vt Set:  [500 mL] 500 mL PEEP:  [5 cmH20] 5 cmH20 Plateau Pressure:  [11 cmH20-15 cmH20] 14 cmH20 INTAKE /  OUTPUT:  Intake/Output Summary (Last 24 hours) at 11/17/14 1430 Last data filed at 11/17/14 1200  Gross per 24 hour  Intake 960.88 ml  Output    265 ml  Net 695.88 ml    PHYSICAL EXAMINATION: General:  Cachectic, unresponsive, severe ballistic myoclonus, temporal wasting Neuro:  RASS -5, pupils react, frequent myoclonus, BLE contractures-->continued HEENT:  NCAT  Cardiovascular:  Reg, no M  Lungs:  No adventitious sounds.  Abdomen:  Scaphoid, diminished BS, G tube site clean Ext: severe LE contractures, no edema, warm Skin:  Has sacral wound, doesn't look infected.   LABS:  CBC  Recent Labs Lab 12/01/2014 1505 11/20/2014 1741 11/17/14 0255  WBC 13.9* 21.7* 14.9*  HGB 10.7* 10.0* 9.3*  HCT 34.6* 30.4* 28.8*  PLT 157 137* 147*   Coag's  Recent Labs Lab 11/10/2014 1505  INR 1.08   BMET  Recent Labs Lab 11/10/14 1840 11/30/2014 1505 11/17/14 0255  NA  --  139 140  K 4.6 4.4 3.5  CL  --  99 104  CO2  --  25 28  BUN  --  22 20  CREATININE  --  0.64 0.47*  GLUCOSE  --  252* 106*   Electrolytes  Recent Labs Lab 11/15/2014 1505 11/17/14 0255  CALCIUM 8.0* 8.2*  MG  --  1.9  PHOS  --  3.3   Sepsis Markers  Recent Labs Lab 11/09/2014 1519  LATICACIDVEN 8.27*   ABG  Recent Labs Lab 12/04/2014 1739  PHART  7.407  PCO2ART 50.3*  PO2ART 49.0*   Liver Enzymes  Recent Labs Lab July 29, 2015 1505  AST 89*  ALT 50  ALKPHOS 180*  BILITOT 0.5  ALBUMIN 2.5*   Cardiac Enzymes  Recent Labs Lab July 29, 2015 1741 July 29, 2015 2350 11/17/14 0255  TROPONINI 0.06* 0.07* 0.06*   Glucose  Recent Labs Lab July 29, 2015 2000 July 29, 2015 2321 11/17/14 0323 11/17/14 0739 11/17/14 1112  GLUCAP 94 104* 91 102* 119*    CXR: NACPD  ASSESSMENT / PLAN:  PULMONARY A: Acute resp failure post arrest: suspect mucous plugging in quadriplegic pt Tracheostomy tube status   P:   Vent settings established Vent bundle implemented Daily SBT if/when meets criteria Will d/w  family. Need to consider palliative focus  CARDIOVASCULAR A: PEA arrest ~17 mins Cardiomyopathy - LVEF 30-35% 10/24/14 with diffuse HK-->EF Now 60-65% P:  MAP goal 65 mmHg Holding prior anti-hypertensives  Cont tele euvolemic volume status goal   RENAL A:  Oliguria  P:   Monitor BMET intermittently Cont IVFs Monitor I/Os Correct electrolytes as indicated Not HD candidate  GASTROINTESTINAL A:   Dysphagia Protein calorie malnutrition G tube status P:   SUP: IV PPI Initiated early TFs   HEMATOLOGIC A:  Anemia without acute blood loss P:  DVT px: SQ heparin Monitor CBC intermittently Transfuse per usual ICU guidelines  INFECTIOUS A:   Leukocytosis - likely reactive P:   Monitor temp, WBC count Micro and abx as above  ENDOCRINE A:   No acute issues P:   Monitor glu on chem panels Consider SSI for glu > 180  NEUROLOGIC A:   Chronic quadriplegia Post anoxic encephalopathy w/Severe myoclonus R/O seizures EEG inconclusive d/t extensive myoclonus 1/8 P:   RASS goal: 0 Neuro input appreciated Keppra adjusted Daily WUA   FAMILY  Mother Dortha Schwalbeet al updated in detail 01/08  - Inter-disciplinary family meet or Palliative Care meeting due by:  11/13/2014  NP SUMMARY STATEMENT  Acute cardiopulm arrest suspect this was mucous plugging in a quadriplegic. Down time ~17 minutes w/ post- arrest exam c/w severe anoxic injury due w/ Myoclonus. Prognosis very poor and suspect that change for meaningful recovery near zero. Family is here. Will speak with them to further discuss goals of care. We need to establish limits. Given the extensive exam also think that w/d would be an appropriate consideration.   Simonne MartinetPeter E Babcock ACNP-BC [facility] Pulmonary/Critical Care Pager # [phone] OR # [phone] if no answer 11/17/2014, 2:30 PM   Attending Note:  I have examined patient, reviewed labs, studies and notes. I have discussed the case with Kreg ShropshireP Babcock, and I agree with the data and  plans as amended above. Pt is unresponsive and showing signs of anoxia. There is myoclonus on exam. Very unfortunate situation with likely no chance for a meaningful recovery to even his previous level of functioning. At this point I recommend consideration of withdrawal of care. Will speak with the patient's family to discuss. Independent critical care time is 50 minutes.   Levy Pupaobert Ravis Herne, MD, PhD 11/17/2014, 5:06 PM [facility] [phone] or if no answer [phone]

## 2014-11-18 ENCOUNTER — Encounter: Payer: Self-pay | Admitting: Internal Medicine

## 2014-11-18 DIAGNOSIS — I214 Non-ST elevation (NSTEMI) myocardial infarction: Secondary | ICD-10-CM | POA: Insufficient documentation

## 2014-11-18 DIAGNOSIS — J440 Chronic obstructive pulmonary disease with acute lower respiratory infection: Secondary | ICD-10-CM | POA: Insufficient documentation

## 2014-11-18 DIAGNOSIS — I469 Cardiac arrest, cause unspecified: Secondary | ICD-10-CM

## 2014-11-18 DIAGNOSIS — Z931 Gastrostomy status: Secondary | ICD-10-CM | POA: Insufficient documentation

## 2014-11-18 DIAGNOSIS — B3749 Other urogenital candidiasis: Secondary | ICD-10-CM | POA: Insufficient documentation

## 2014-11-18 DIAGNOSIS — G253 Myoclonus: Secondary | ICD-10-CM

## 2014-11-18 DIAGNOSIS — L89159 Pressure ulcer of sacral region, unspecified stage: Secondary | ICD-10-CM | POA: Insufficient documentation

## 2014-11-18 MED ORDER — LORAZEPAM 2 MG/ML IJ SOLN
2.0000 mg | INTRAMUSCULAR | Status: DC
  Administered 2014-11-18 – 2014-11-19 (×6): 2 mg via INTRAVENOUS
  Filled 2014-11-18 (×6): qty 1

## 2014-11-18 NOTE — Assessment & Plan Note (Signed)
With sepsis, requiring IV abx and prolonged intubation

## 2014-11-18 NOTE — Assessment & Plan Note (Signed)
Pt is heavy smoker and prior PNA and any PNA informed by that and directly impacts on probability of resp failure

## 2014-11-18 NOTE — Assessment & Plan Note (Signed)
Baseline PEG feedings ina ddition to po feedings

## 2014-11-18 NOTE — Assessment & Plan Note (Signed)
Pt was hospitalized for resp failure with hypercarbia, extreme  (CO2 -90), with HCAP intubated and tx to [facility] 12/16 intubated and trached and ultimately finally resoved and d/c to SNF 1/6
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
## 2014-11-18 NOTE — Assessment & Plan Note (Signed)
Late entry by [facility] ;will have wound nurse follow

## 2014-11-18 NOTE — Assessment & Plan Note (Signed)
PEG tube feedings for baseline feeds and for protrin malnutrition

## 2014-11-18 NOTE — Assessment & Plan Note (Signed)
Occurred during [facility] Hospitalization. Pt was asymptomatic with it. Bblockers were continued

## 2014-11-18 NOTE — Assessment & Plan Note (Signed)
During hosp for PNA with resp failure and intubation pt developed UTI with yeast which was treated

## 2014-11-18 NOTE — Progress Notes (Signed)
PULMONARY / CRITICAL CARE MEDICINE   Name: Brandon SchwalbeRobert L Montgomery MRN: [id number] DOB: 04/27/1966    ADMISSION DATE:  11/18/2014 CONSULTATION DATE:  12/04/2014  REFERRING MD :  EDP  CHIEF COMPLAINT:  PEA arrest.   INITIAL PRESENTATION:  [age] M quad with recent hospitalization under PCCM care for resp failure which culminated in trach tube placement 12/16 and [facility]. He was discharged to a SNF a couple of days prior to this admission. Complained of increasing congestion on evening prior to and morning of admission. Found unresponsive and pulseless on day of admission. CPR/ACLS initiated with 17 mins until ROSC. Duration of pulselessness prior to discovery is unknown. In ED, exhibited severe myoclonus.   MAJOR EVENTS/TEST RESULTS:  01/08 Admitted from SNF post PEA arrest with severe myoclonus 01/08 CT head: NAD 01/08 EEG: This is a technically difficult electroencephalogram due to the predominance of muscle and movement artifact. This artifact is accentuated by the frequent episodes of myoclonus captured during the recording. Due to the artifact it is difficult to determine if this myoclonus is epileptic in etiology but due to the attenuation noted during the end of the tracing this can not be excluded. 01/09 TTE: EF 60-65% 1/9 CXR: negative   INDWELLING DEVICES:: Trach tube (chronic) G tube (chronic)   SUBJECTIVE: unable to assess due to mental status   VITAL SIGNS: Temp:  [97.2 F (36.2 C)-99.3 F (37.4 C)] 99.3 F (37.4 C) (01/10 0435) Pulse Rate:  [75-137] 121 (01/10 0920) Resp:  [11-27] 12 (01/10 0920) BP: (86-160)/(46-88) 86/46 mmHg (01/10 0435) SpO2:  [84 %-100 %] 100 % (01/10 0920) FiO2 (%):  [28 %-98 %] 98 % (01/10 0920) HEMODYNAMICS:   VENTILATOR SETTINGS: Vent Mode:  [-] PRVC FiO2 (%):  [28 %-98 %] 98 % Set Rate:  [18 bmp] 18 bmp Vt Set:  [500 mL] 500 mL PEEP:  [5 cmH20] 5 cmH20 Plateau Pressure:  [14 cmH20] 14 cmH20 INTAKE / OUTPUT:  Intake/Output Summary  (Last 24 hours) at 11/18/14 1047 Last data filed at 11/18/14 0800  Gross per 24 hour  Intake 1056.55 ml  Output    715 ml  Net 341.55 ml    PHYSICAL EXAMINATION: General:  Cachectic, unresponsive, temporal wasting Neuro:  RASS -5, nonreactive constricted pupils b/l, BLE contractures-->continued, no w/d pain HEENT:  NCAT, nonreactive   Cardiovascular:  tachycardic, no murmurs Lungs:  ctab Abdomen:  Scaphoid, diminished BS, G tube site clean Ext: severe LE contractures, no edema, warm Skin:  sacral wound  LABS:  CBC  Recent Labs Lab 11/30/2014 1505 11/15/2014 1741 11/17/14 0255  WBC 13.9* 21.7* 14.9*  HGB 10.7* 10.0* 9.3*  HCT 34.6* 30.4* 28.8*  PLT 157 137* 147*   Coag's  Recent Labs Lab 11/18/2014 1505  INR 1.08   BMET  Recent Labs Lab 11/14/2014 1505 11/17/14 0255  NA 139 140  K 4.4 3.5  CL 99 104  CO2 25 28  BUN 22 20  CREATININE 0.64 0.47*  GLUCOSE 252* 106*   Electrolytes  Recent Labs Lab 12/07/2014 1505 11/17/14 0255  CALCIUM 8.0* 8.2*  MG  --  1.9  PHOS  --  3.3   Sepsis Markers  Recent Labs Lab 12/05/2014 1519  LATICACIDVEN 8.27*   ABG  Recent Labs Lab 11/20/2014 1739  PHART 7.407  PCO2ART 50.3*  PO2ART 49.0*   Liver Enzymes  Recent Labs Lab 11/15/2014 1505  AST 89*  ALT 50  ALKPHOS 180*  BILITOT 0.5  ALBUMIN 2.5*  Cardiac Enzymes  Recent Labs Lab 12-Dec-2014 1741 2014/12/12 2350 11/17/14 0255  TROPONINI 0.06* 0.07* 0.06*   Glucose  Recent Labs Lab 12-12-2014 2000 12-12-2014 2321 11/17/14 0323 11/17/14 0739 11/17/14 1112  GLUCAP 94 104* 91 102* 119*    CXR:  1/9 negative   ASSESSMENT / PLAN:  PULMONARY A: Acute resp failure post arrest: suspect mucous plugging in quadriplegic pt Tracheostomy tube status  Ventilator removed 1/9   P:   Comfort care on morphine gtt   CARDIOVASCULAR A: PEA arrest ~17 mins H/o Cardiomyopathy - LVEF 30-35% 10/24/14 with diffuse HK-->EF Now 60-65% 11/17/14 Hypotension  P:   Holding prior anti-hypertensives, no pressors comfort care   RENAL A:  Oliguria  P:   No further labs CC Cont IVFs D5 1/2 NS 10 cc/hr  GASTROINTESTINAL A:   Dysphagia Protein calorie malnutrition G tube status P:   No TF currently getting D51/5 NS 10 cc/hr   HEMATOLOGIC A:  Normocytic Anemia without acute blood loss DVT px  P:  No DVT px pt is CC  INFECTIOUS A:   Leukocytosis - likely reactive 1/5 C diff neg  1/8 Bld NTD 1/8 Urine Culture >100 Enterococcus  MRSA neg   P:   Will not trend labs pt CC  ENDOCRINE A:   No acute issues P:   NTD  NEUROLOGIC A:   Chronic quadriplegia Post anoxic encephalopathy w/Severe myoclonus R/O seizures EEG inconclusive d/t extensive myoclonus 1/8 P:   RASS goal: 0 currently -5  Neuro following, Keppra 1500, Depacon 170 mg, prn Ativan 2 mg q6  Pt is comfort care on morphine gtt   FAMILY: bedside 1/10 mother Brandon Montgomery updated.   - Inter-disciplinary family meet or Palliative Care meeting due by:  Already had 1/8 or 1/9   TODAYS SUMMARY STATEMENT:  Patient is comfort care s/p cardiopulmonary arrest with mucous plugging. Ventilator stopped 1/9. On Morphine gtt. Family at bedside   11/18/2014, 10:47 AM Desma Maxim MD [phone]   Attending Note:  I have examined patient, reviewed labs, studies and notes. I have discussed the case with Dr Shirlee Latch, and I agree with the data and plans as amended above. He appears comfortable post discontinuation of MV. Family is at the bedside. Will continue to work on comfort measures.   Levy Pupa, MD, PhD 11/18/2014, 4:10 PM [facility] [phone] or if no answer [phone]

## 2014-11-21 LAB — URINE CULTURE: Colony Count: >=100000

## 2014-11-22 LAB — CULTURE, BLOOD (ROUTINE X 2)
CULTURE: NO GROWTH
CULTURE: NO GROWTH

## 2014-11-22 NOTE — Discharge Summary (Signed)
Brandon Montgomery was a 49 y.o. male admitted on 11/17/2014 PEA cardiac arrest with 17 minutes before ROSC.  He had myoclonus post-arrest.  He was evaluated by neurology and started on AED's.  He had tracheostomy after recent hospitalization.  His family was appraised of poor outcome, and decision was made to transition to DNR status and comfort care.  He subsequently expired on 07-07-15.  Impression: PEA cardiac arrest Myoclonic seizures Anoxic encephalopathy Acute on chronic hypoxic, hypercapnic respiratory failure Oliguria Acute kidney injury 2nd to cardiogenic shock Cardiogenic shock Non ischemic cardiomyopathy with acute systolic heart failure Dysphagia s/p G tube Severe protein calorie malnutrition Anemia of chronic disease Enterococcus UTI Sepsis Quadriplegia   Coralyn HellingVineet Colbie Sliker, MD [facility] 11/22/2014, 12:40 PM

## 2014-11-22 NOTE — Progress Notes (Signed)
Vidal SchwalbeRobert L Bueche was a 49 y.o. male admitted on 11/17/2014 PEA cardiac arrest with 17 minutes before ROSC.  He had myoclonus post-arrest.  He was evaluated by neurology and started on AED's.  He had tracheostomy after recent hospitalization.  His family was appraised of poor outcome, and decision was made to transition to DNR status and comfort care.  He subsequently expired on 15-Apr-2015.  Impression: PEA cardiac arrest Myoclonic seizures Anoxic encephalopathy Acute on chronic hypoxic, hypercapnic respiratory failure Oliguria Acute kidney injury 2nd to cardiogenic shock Cardiogenic shock Non ischemic cardiomyopathy with acute systolic heart failure Dysphagia s/p G tube Severe protein calorie malnutrition Anemia of chronic disease Enterococcus UTI Sepsis Quadriplegia   Coralyn HellingVineet Dalina Samara, MD [facility] Pulmonary/Critical Care 11/22/2014, 12:39 PM

## 2014-12-10 NOTE — Progress Notes (Signed)
Pt. Is asystole in the monitor, no breathing, no bp noted.assessment made with  the charge nurse. Pronounced dead at 11:15 am, brother Iantha FallenKenneth at the bedside . MD  and Martiniquecarolina donor made aware. Post mortem care done.

## 2014-12-10 NOTE — Care Management Note (Signed)
    Page 1 of 1   11/30/2014     9:30:45 AM CARE MANAGEMENT NOTE 12/07/2014  Patient:  Brandon Montgomery,Brandon Montgomery   Account Number:  [id number]  Date Initiated:  11/28/2014  Documentation initiated by:  Junius CreamerWELL,DEBBIE  Subjective/Objective Assessment:   adm w cardiac arrest     Action/Plan:   from nsg facility   Anticipated DC Date:     Anticipated DC Plan:    In-house referral  Clinical Social Worker         Choice offered to / List presented to:             Status of service:   Medicare Important Message given?  YES (If response is "NO", the following Medicare IM given date fields will be blank) Date Medicare IM given:  11/10/2014 Medicare IM given by:  Junius CreamerWELL,DEBBIE Date Additional Medicare IM given:   Additional Medicare IM given by:    Discharge Disposition:    Per UR Regulation:  Reviewed for med. necessity/level of care/duration of stay  If discussed at Long Length of Stay Meetings, dates discussed:    Comments:

## 2014-12-10 NOTE — Consult Note (Signed)
WOC consulted however after review of the chart this patient has been made full comfort care and I will not consult for this reason.  Death seems imminent for this patient. Family at bedside.  Current dressings will meet the needs for wound care at this point.   Discussed POC with patient's brother and bedside nurse.  Re consult if needed, will not follow at this time. Thanks  Chasity Outten Foot Lockerustin RN, CWOCN [phone])

## 2014-12-10 NOTE — Progress Notes (Signed)
Brandon SchwalbeRobert L Yanes is a 49 y.o. male admitted on 11/24/2014 PEA cardiac arrest with 17 minutes before ROSC.  He had myoclonus post-arrest.  He was evaluated by neurology and started on AED's.  He had tracheostomy after recent hospitalization.  His family was appraised of poor outcome, and decision was made to transition to DNR status and comfort care.  Subjective: Appears comfortable  Objective: Blood pressure 86/46, pulse 93, temperature 102.4 F (39.1 C), temperature source Core (Comment), resp. rate 9, height 5\' 7"  (1.702 m), weight 98 lb 5.2 oz (44.6 kg), SpO2 89 %. General: no distress HEENT: trach site clean Cardiac: regular Chest: diminished breath sounds Abd: soft Ext: decreased muscle bulk Skin: no rashes  Impression: PEA cardiac arrest Myoclonic seizures Anoxic encephalopathy Acute on chronic hypoxic, hypercapnic respiratory failure Oliguria Acute kidney injury 2nd to cardiogenic shock Cardiogenic shock Non ischemic cardiomyopathy with acute systolic heart failure Dysphagia s/p G tube Severe protein calorie malnutrition Anemia of chronic disease Enterococcus UTI Sepsis Quadriplegia  Plan: DNR Transfer to medical floor bed Continue AEDs Scheduled ativan Continue Morphine gtt PRN tylenol  Updated pt's brother about plan  Coralyn HellingVineet Alga Southall, MD [facility] 12/06/2014, 9:30 AM Pager:  [phone] After 3pm call: [phone]

## 2014-12-10 NOTE — Progress Notes (Signed)
Nutrition Brief Note  Chart reviewed. Pt now transitioning to comfort care.  No further nutrition interventions warranted at this time.  Please re-consult as needed.   Drinda Belgard A. Daylan Juhnke, RD, LDN, CDE Pager: [phone] After hours Pager: [phone]  

## 2014-12-10 NOTE — Progress Notes (Signed)
Body brought to the morge after family left.
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
## 2014-12-10 NOTE — Progress Notes (Signed)
10 ml of morphine gtt wasted  In a sink with another RN.  Burnice Loganecena, Charita I, RN   Verified  Eliane DecreeSmith, Dom Haverland Moore, RN

## 2014-12-10 DEATH — deceased

## 2016-08-26 IMAGING — DX DG CHEST 1V PORT
1 series · 1 of 1 positions shown · non-contrast
Comparison: Portable exam 4440 hr compared to 10/11/2014 at 3112 hr

CLINICAL DATA: Acute respiratory failure, hypoxia, history smoking

EXAM:
PORTABLE CHEST - 1 VIEW

[chest ap]
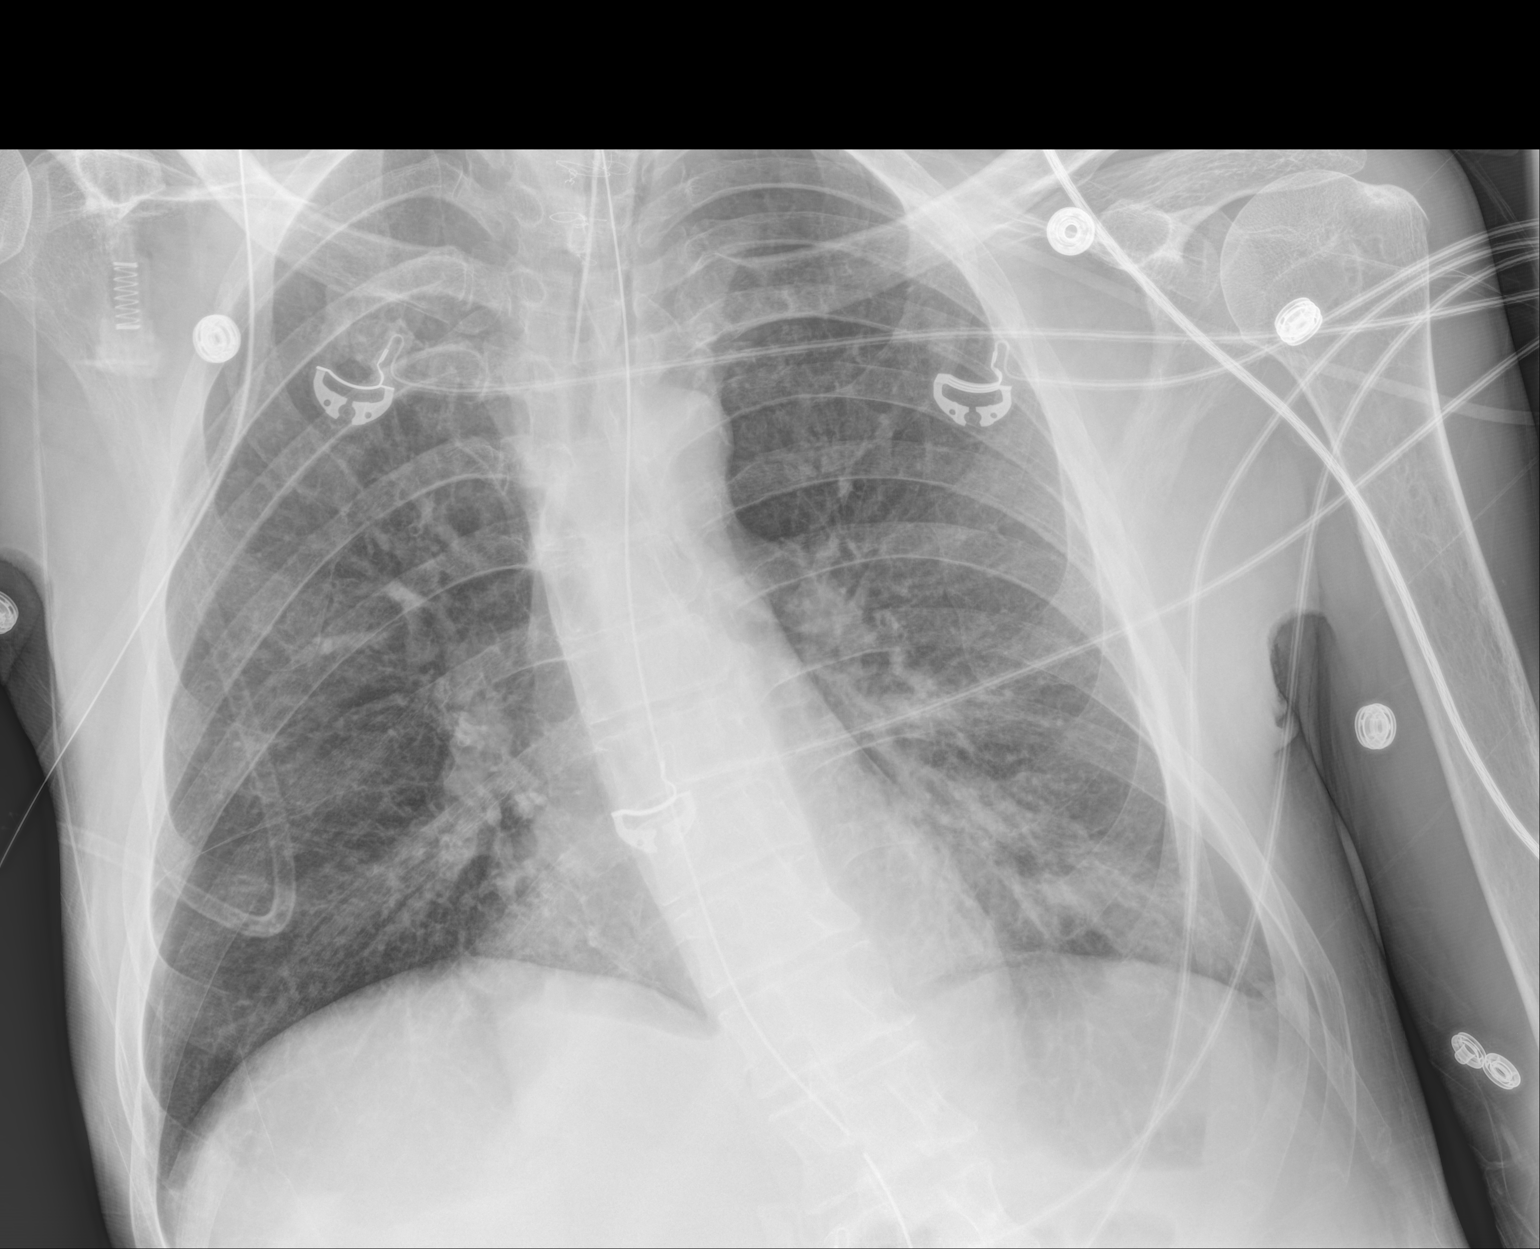

[1 of 1 positions shown; findings below may reference images not displayed]

FINDINGS: Tip of endotracheal tube projects 5.0 cm above carina.

Nasogastric tube extends into stomach.

Normal heart size, mediastinal contours, and pulmonary vascularity.

Mild LEFT lower lobe atelectasis versus infiltrate slightly
decreased versus earlier study.

Remaining lungs grossly clear.

No pleural effusion, pneumothorax or acute osseous findings.

Bones appear demineralized with thoracolumbar scoliosis noted.
IMPRESSION: LEFT lower lobe infiltrate versus atelectasis, slightly improved.

## 2016-08-29 IMAGING — DX DG CHEST 1V PORT
1 series · 1 of 1 positions shown · non-contrast
Comparison: 10/13/2014

CLINICAL DATA: Endotracheal tube intubation

EXAM:
PORTABLE CHEST - 1 VIEW

[chest ap]
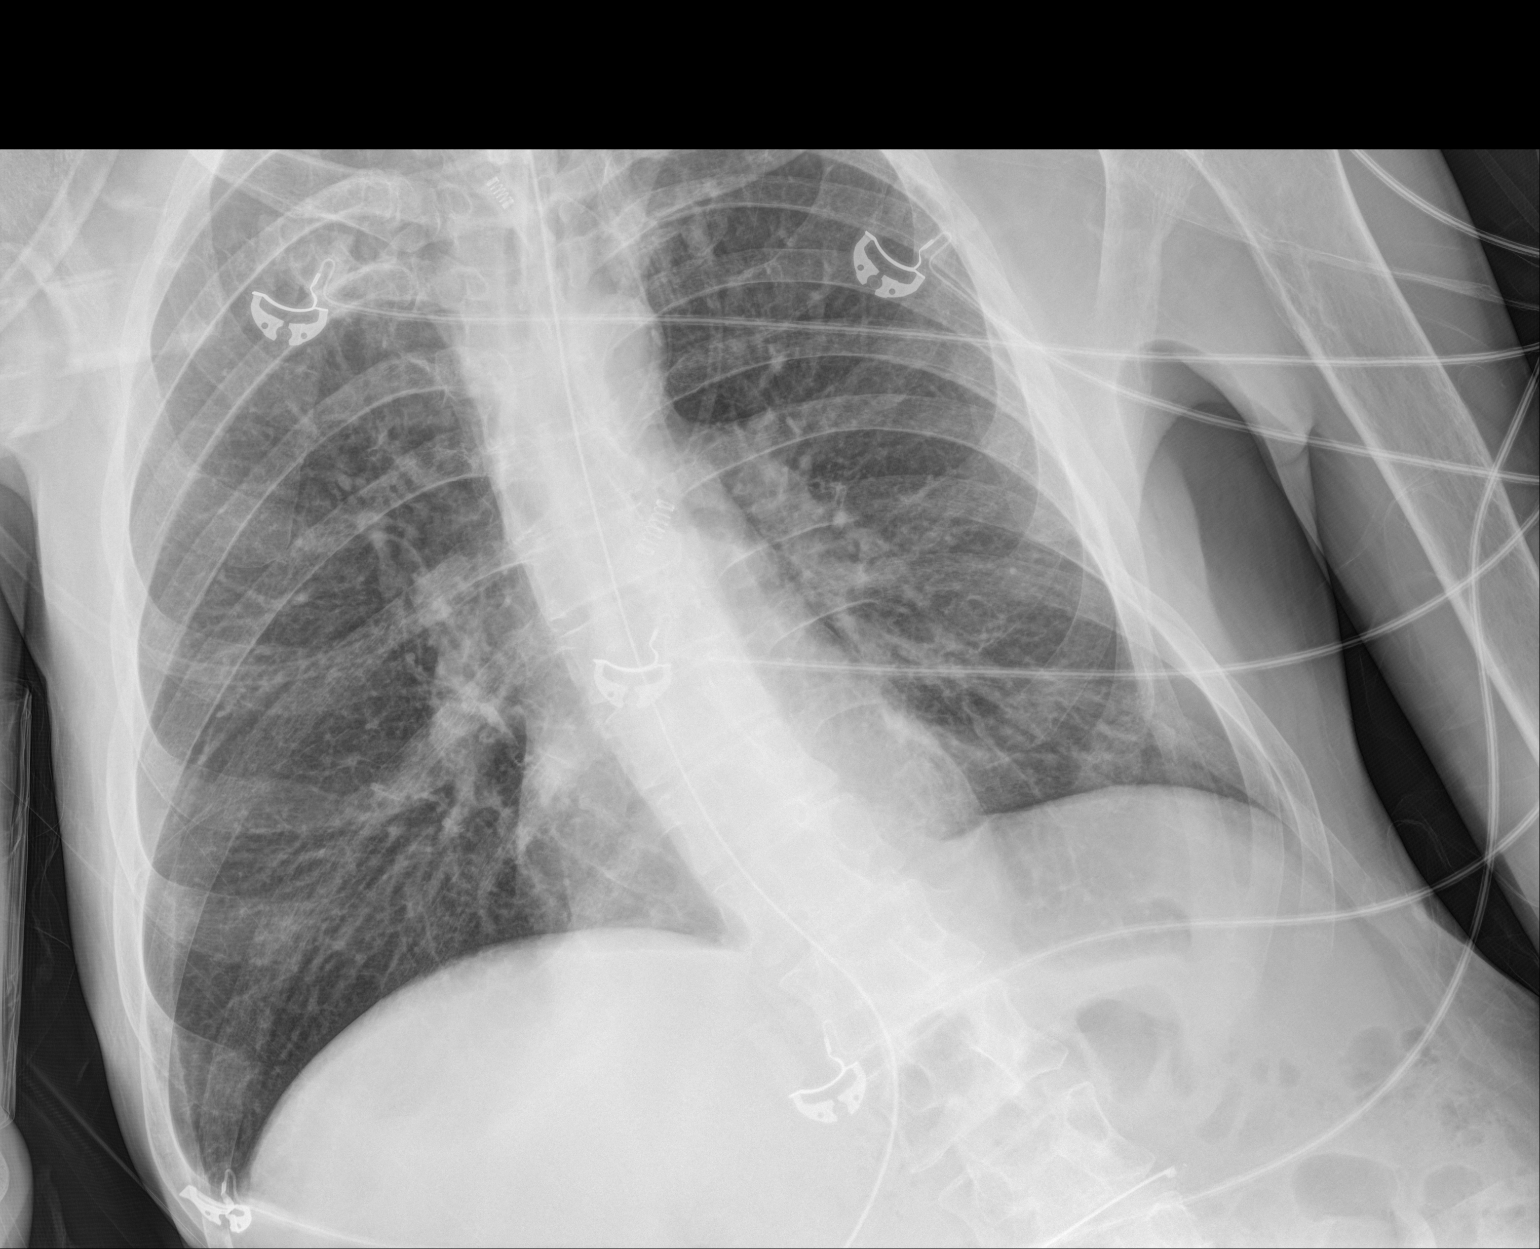

[1 of 1 positions shown; findings below may reference images not displayed]

FINDINGS: The heart size and mediastinal contours are within normal limits.
Both lungs are clear. The visualized skeletal structures are
unremarkable. Endotracheal tube is appropriately positioned.
Nasogastric tube terminates over the body of the stomach.
IMPRESSION: Endotracheal tube appropriately positioned.

## 2016-08-30 IMAGING — DX DG CHEST 1V PORT
1 series · 1 of 1 positions shown · non-contrast
Comparison: Radiograph 10/15/2014

CLINICAL DATA: Checking endotracheal tube placement

EXAM:
PORTABLE CHEST - 1 VIEW

[AP]
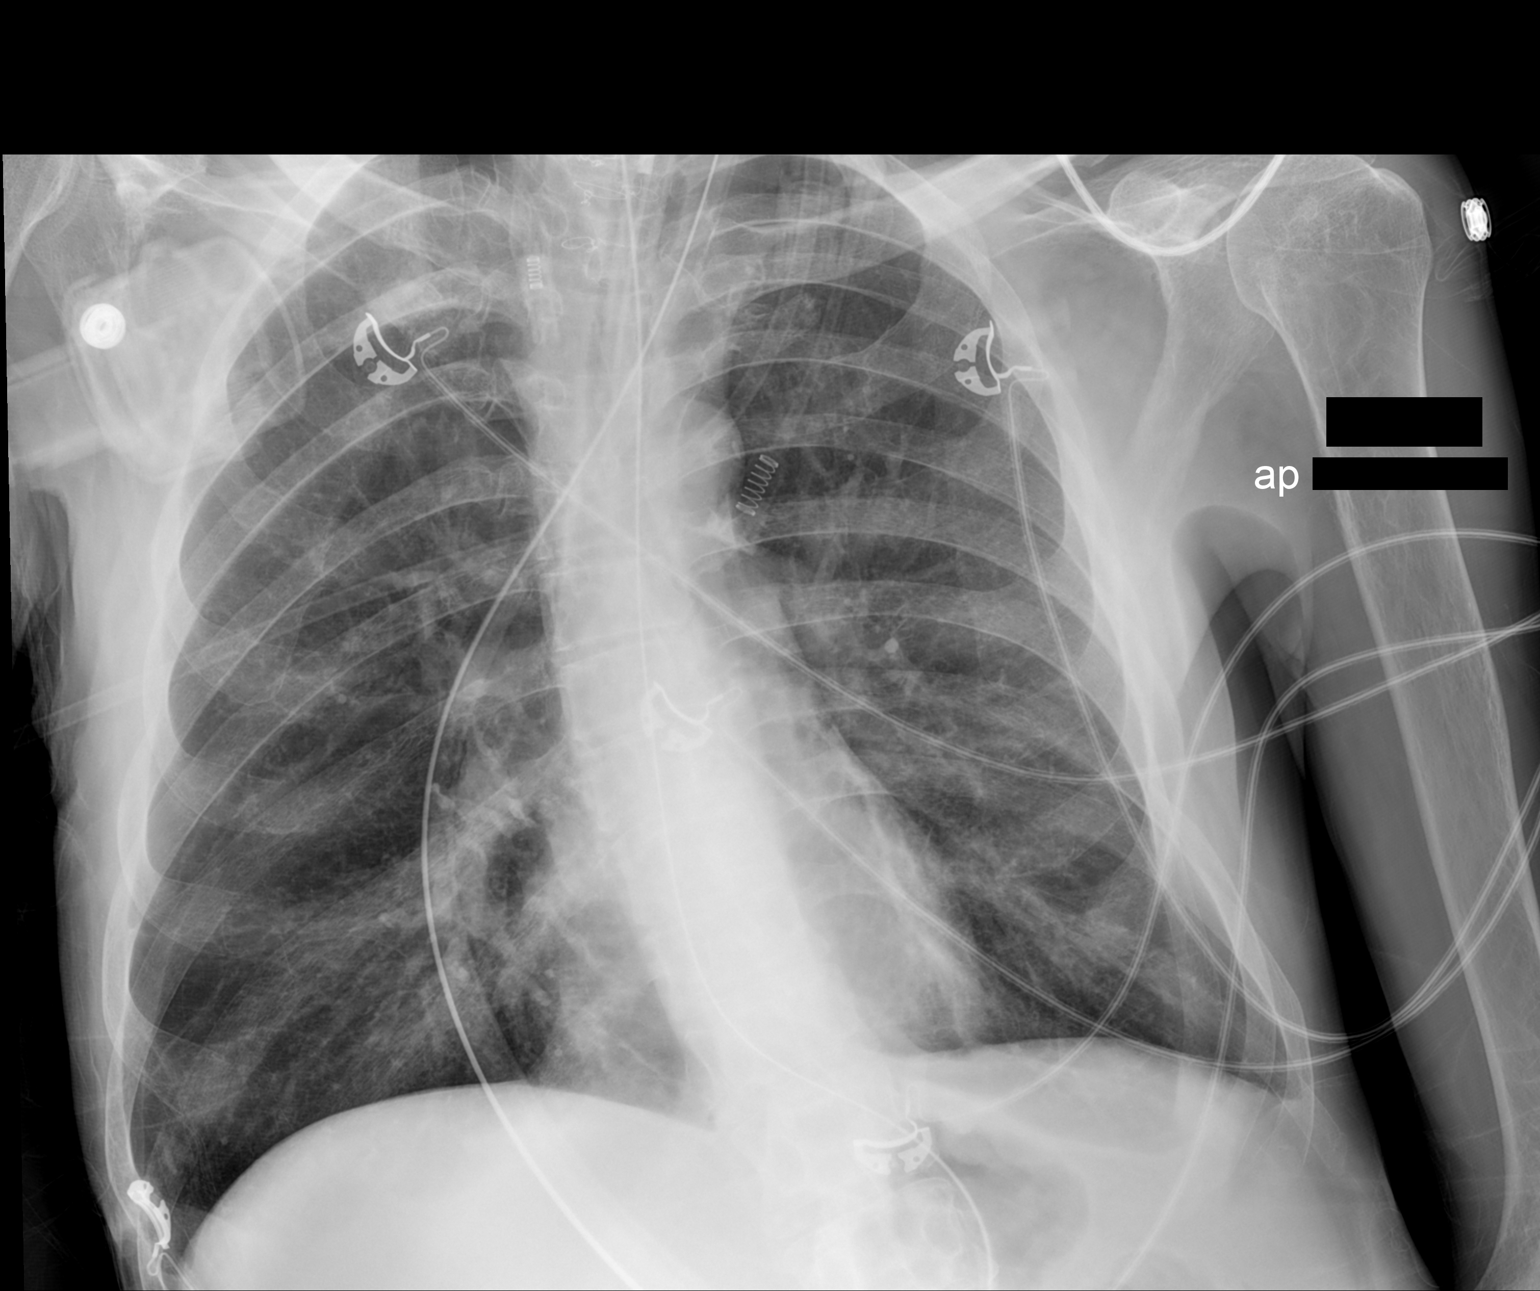

[1 of 1 positions shown; findings below may reference images not displayed]

FINDINGS: Endotracheal tube unchanged. Normal cardiac silhouette. Lungs are
clear. No pneumothorax or pulmonary edema.
IMPRESSION: No interval change. Endotracheal tube appears in good position.
Lungs well expanded.

## 2016-08-31 IMAGING — DX DG CHEST 1V PORT
1 series · 2 of 2 positions shown · non-contrast
Comparison: 10/15/2014.

CLINICAL DATA: Respiratory failure.

EXAM:
PORTABLE CHEST - 1 VIEW

[Series 1: chest ap · 0.14mm/px · 2 of 2 slices shown]
[im 1/2]
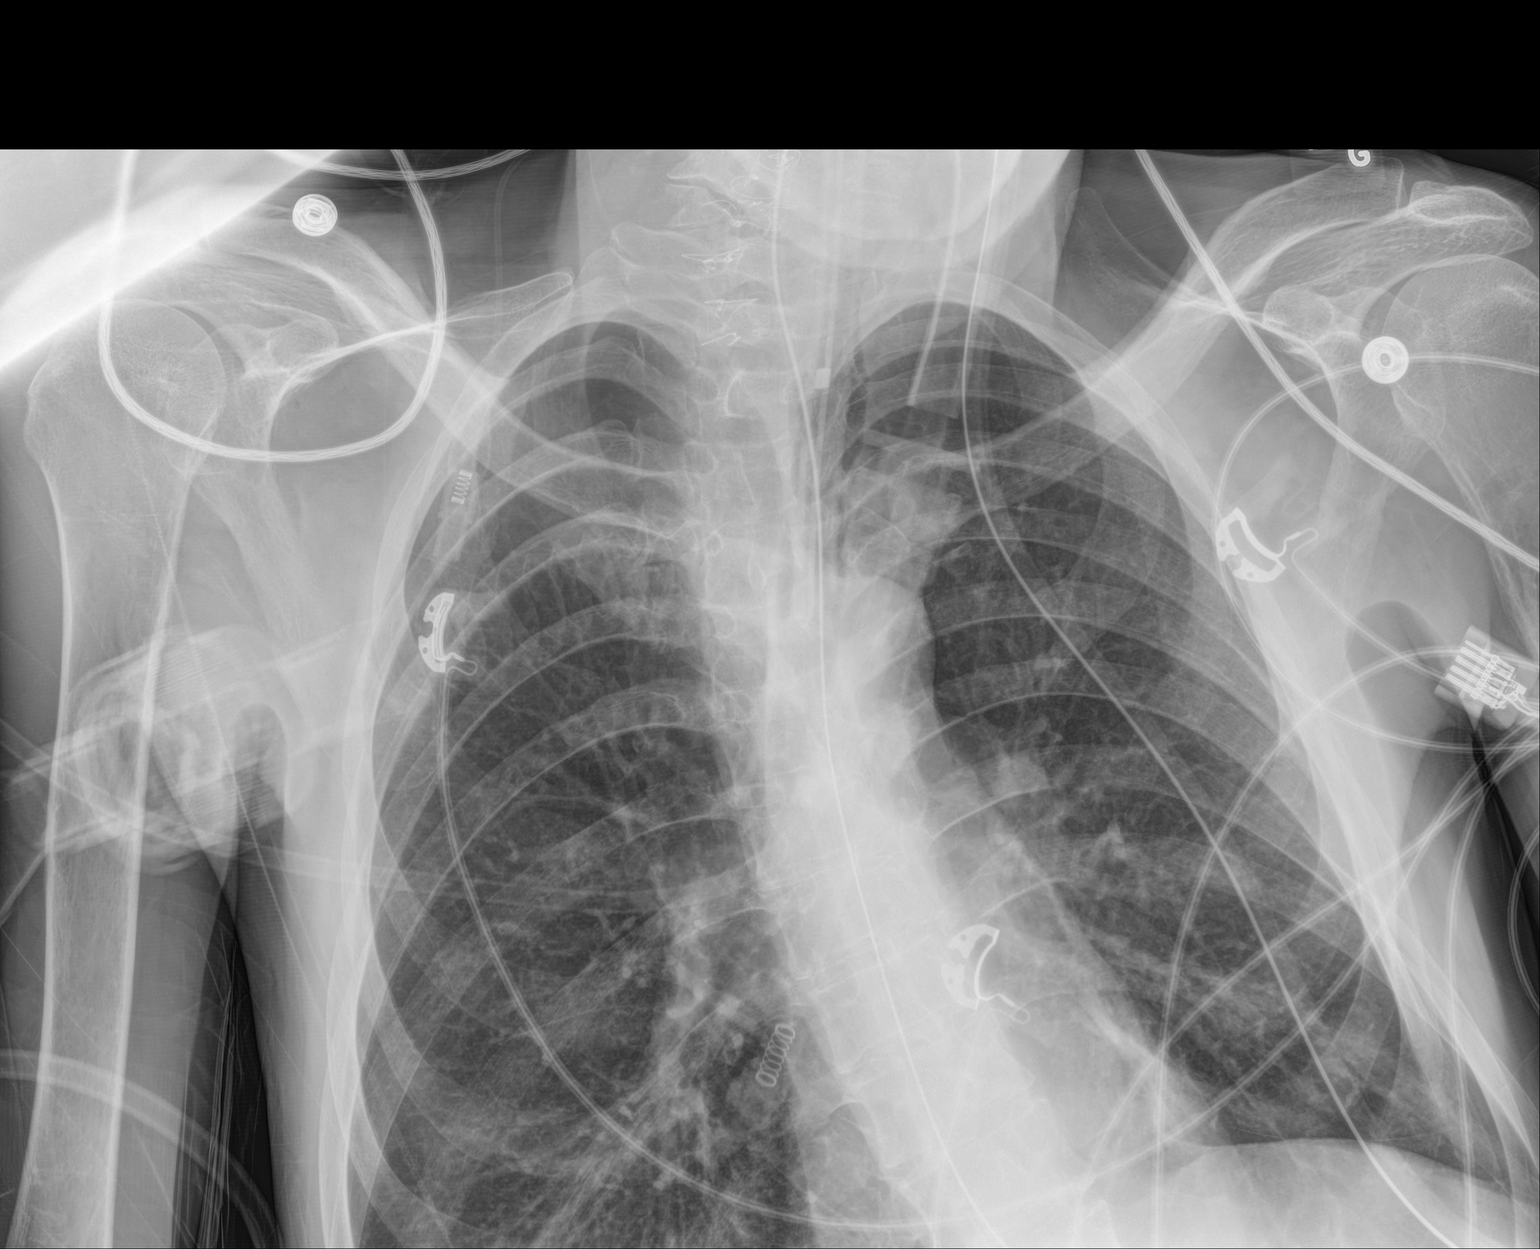
[im 2/2]
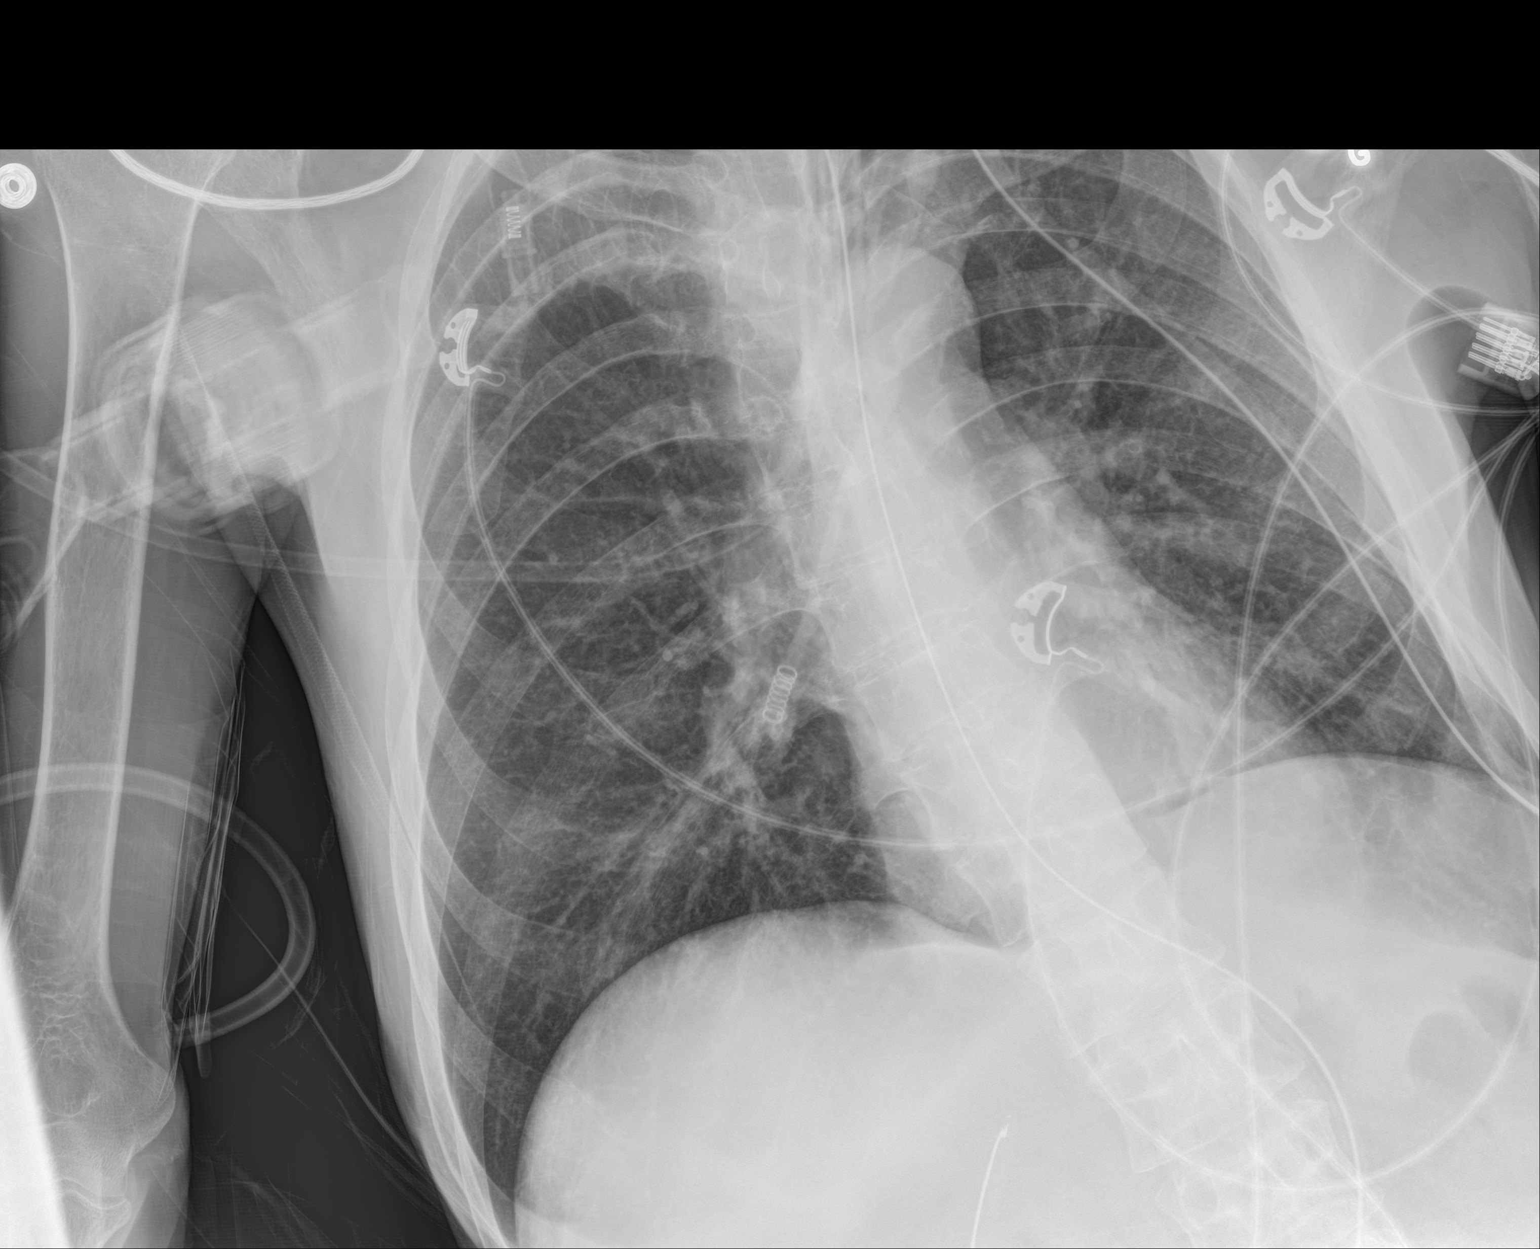

[2 of 2 positions shown; findings below may reference images not displayed]

FINDINGS: Endotracheal tube and NG tube in good anatomic position.
Endotracheal tube tip 2.5 cm above the carina. Lungs are clear. No
pleural effusion or pneumothorax. Heart size normal.
IMPRESSION: 1. Endotracheal tube and NG tube in good anatomic position.
Endotracheal tube 2.5 cm above the carina.
2. No acute cardiopulmonary disease identified.

## 2016-08-31 IMAGING — DX DG CHEST 1V PORT
2 series · 2 of 2 positions shown · non-contrast
Comparison: 10/16/2014

CLINICAL DATA: Quadriplegic, acute respiratory failure, confirm ETT
placement

EXAM:
PORTABLE CHEST - 1 VIEW

[chest ap (1 of 2)]
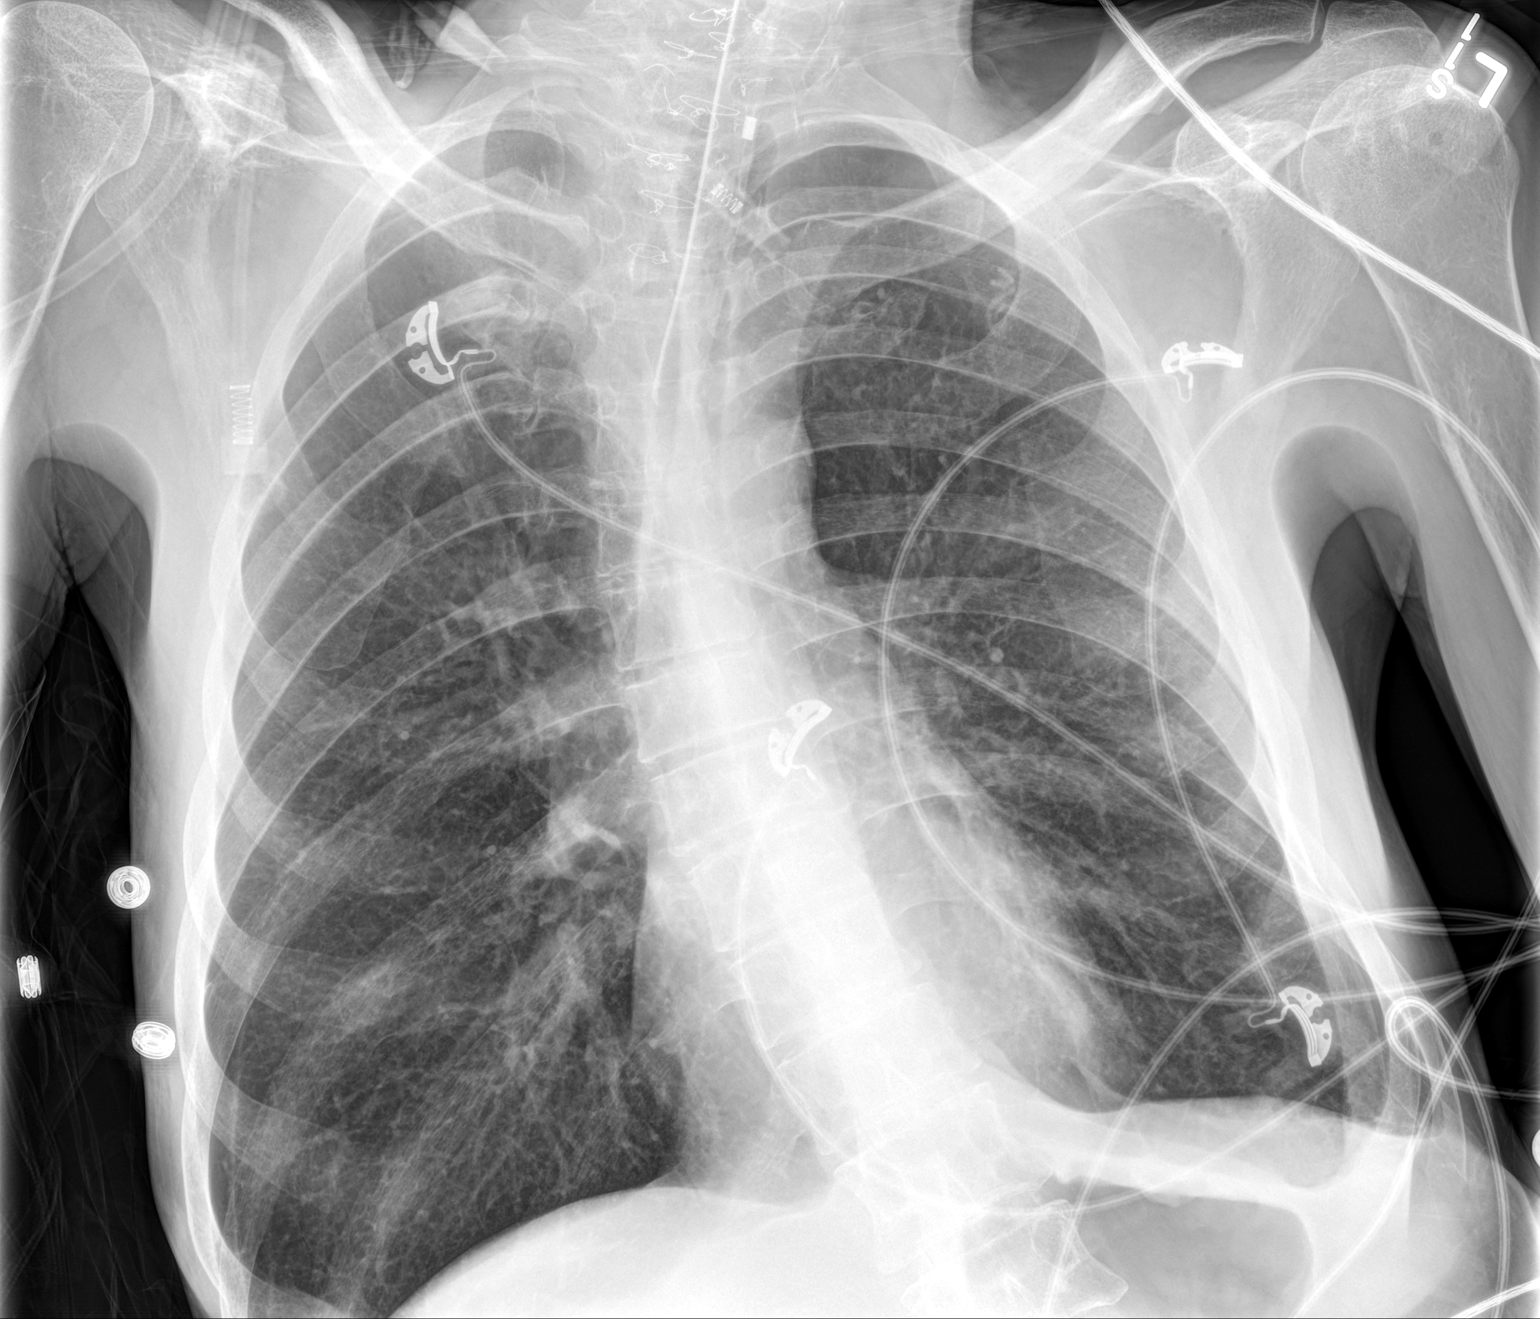

[chest ap (2 of 2)]
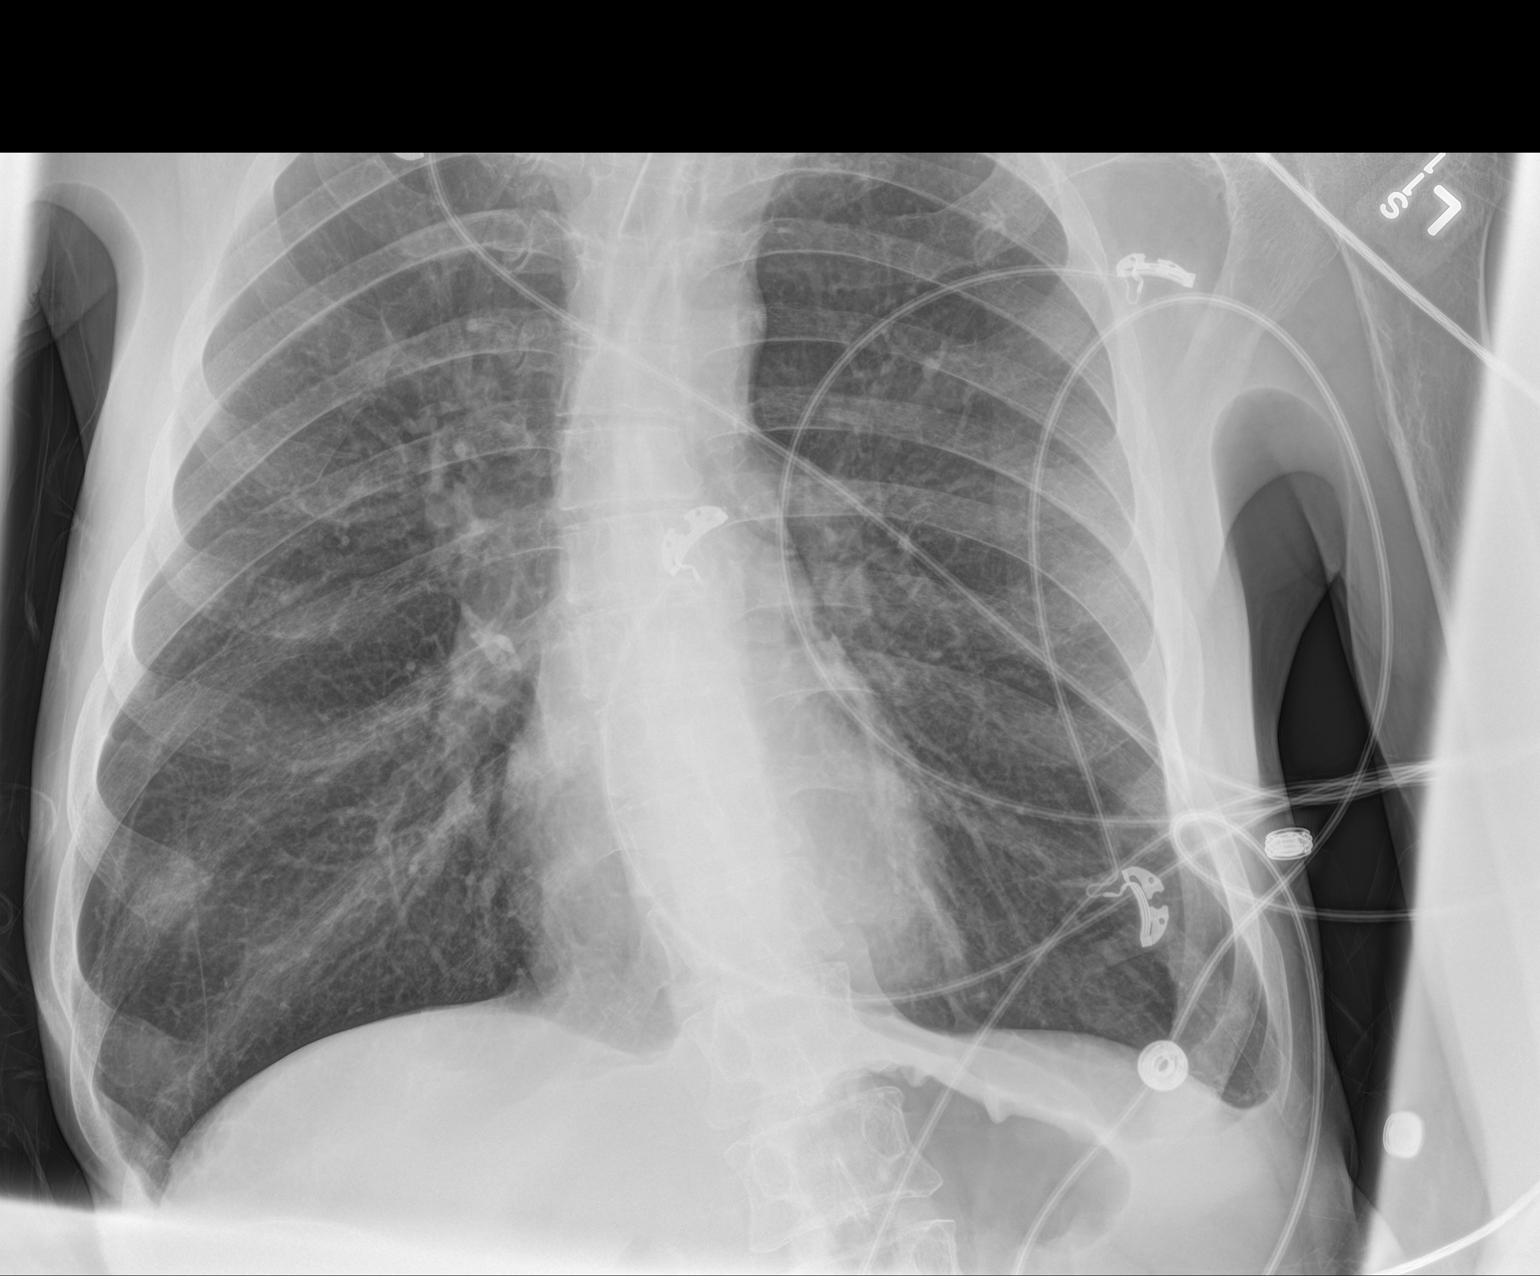

[2 of 2 positions shown; findings below may reference images not displayed]

FINDINGS: Endotracheal tube with the tip 3 cm above the carina. Interval
removal of the nasogastric tube. Hyperinflated lungs bilaterally. No
pleural effusion, pneumothorax or focal consolidation. Bilateral
mild chronic interstitial thickening. Normal cardiomediastinal
silhouette. Dextroscoliosis of the thoracolumbar spine.
IMPRESSION: Endotracheal tube with the tip 3 cm above the carina.
